# Patient Record
Sex: Male | Born: 1940
Health system: Southern US, Community
[De-identification: ages and names within clinical notes are randomized; demographics above are authoritative.]

## PROBLEM LIST (undated history)

## (undated) DIAGNOSIS — Z8619 Personal history of other infectious and parasitic diseases: Secondary | ICD-10-CM

## (undated) DIAGNOSIS — H409 Unspecified glaucoma: Secondary | ICD-10-CM

## (undated) DIAGNOSIS — I10 Essential (primary) hypertension: Secondary | ICD-10-CM

## (undated) HISTORY — DX: Personal history of other infectious and parasitic diseases: Z86.19

## (undated) HISTORY — DX: Essential (primary) hypertension: I10

## (undated) HISTORY — DX: Unspecified glaucoma: H40.9

## (undated) HISTORY — PX: CATARACT EXTRACTION, BILATERAL: SHX1313

---

## 2007-07-20 ENCOUNTER — Other Ambulatory Visit: Payer: Self-pay

## 2007-07-20 ENCOUNTER — Ambulatory Visit: Payer: Self-pay | Admitting: Ophthalmology

## 2007-08-08 ENCOUNTER — Ambulatory Visit: Payer: Self-pay | Admitting: Ophthalmology

## 2007-09-05 ENCOUNTER — Ambulatory Visit: Payer: Self-pay | Admitting: Ophthalmology

## 2007-09-19 ENCOUNTER — Ambulatory Visit: Payer: Self-pay | Admitting: Ophthalmology

## 2014-07-06 ENCOUNTER — Telehealth: Payer: Self-pay

## 2014-07-06 NOTE — Telephone Encounter (Signed)
Pt walked in requesting refill lisinopril HCTZ 20-12.5 mg to [facility] [address]. Pt has appt to establish with Dr Diona Browner on 08/16/14. Pt does not want to get refill from present doctor; advised pt he needs to take his med; offered to look for sooner appt to establish and pt said no; pt will go to [facility] to gt med until appt time.

## 2014-08-16 ENCOUNTER — Encounter: Payer: Self-pay | Admitting: Family Medicine

## 2014-08-16 ENCOUNTER — Ambulatory Visit (INDEPENDENT_AMBULATORY_CARE_PROVIDER_SITE_OTHER): Payer: Medicare Other | Admitting: Family Medicine

## 2014-08-16 ENCOUNTER — Encounter (INDEPENDENT_AMBULATORY_CARE_PROVIDER_SITE_OTHER): Payer: Self-pay

## 2014-08-16 VITALS — BP 122/90 | HR 64 | Temp 98.7°F | Ht 68.75 in | Wt 197.0 lb

## 2014-08-16 DIAGNOSIS — N529 Male erectile dysfunction, unspecified: Secondary | ICD-10-CM

## 2014-08-16 DIAGNOSIS — H409 Unspecified glaucoma: Secondary | ICD-10-CM | POA: Insufficient documentation

## 2014-08-16 DIAGNOSIS — I10 Essential (primary) hypertension: Secondary | ICD-10-CM

## 2014-08-16 DIAGNOSIS — D400 Neoplasm of uncertain behavior of prostate: Secondary | ICD-10-CM | POA: Insufficient documentation

## 2014-08-16 DIAGNOSIS — Z13220 Encounter for screening for lipoid disorders: Secondary | ICD-10-CM

## 2014-08-16 LAB — LIPID PANEL
CHOLESTEROL: 172 mg/dL (ref 0–200)
HDL: 37.8 mg/dL — ABNORMAL LOW (ref >39.00)
LDL CALC: 118 mg/dL — AB (ref 0–99)
NonHDL: 134.2
Total CHOL/HDL Ratio: 5
Triglycerides: 81 mg/dL (ref 0.0–149.0)
VLDL: 16.2 mg/dL (ref 0.0–40.0)

## 2014-08-16 LAB — COMPREHENSIVE METABOLIC PANEL
ALBUMIN: 4.2 g/dL (ref 3.5–5.2)
ALT: 18 U/L (ref 0–53)
AST: 16 U/L (ref 0–37)
Alkaline Phosphatase: 57 U/L (ref 39–117)
BUN: 20 mg/dL (ref 6–23)
CALCIUM: 9.4 mg/dL (ref 8.4–10.5)
CO2: 31 mEq/L (ref 19–32)
Chloride: 103 mEq/L (ref 96–112)
Creatinine, Ser: 1.08 mg/dL (ref 0.40–1.50)
GFR: 86.4 mL/min (ref >60.00)
GLUCOSE: 92 mg/dL (ref 70–99)
POTASSIUM: 4.4 meq/L (ref 3.5–5.1)
SODIUM: 139 meq/L (ref 135–145)
Total Bilirubin: 0.7 mg/dL (ref 0.2–1.2)
Total Protein: 7.4 g/dL (ref 6.0–8.3)

## 2014-08-16 LAB — PSA, MEDICARE: PSA: 15.2 ng/ml — ABNORMAL HIGH (ref 0.10–4.00)

## 2014-08-16 MED ORDER — LISINOPRIL-HYDROCHLOROTHIAZIDE 20-12.5 MG PO TABS: 1.0000 | ORAL_TABLET | Freq: Every day | ORAL | Status: DC

## 2014-08-16 NOTE — Progress Notes (Signed)
Pre visit review using our clinic review tool, if applicable. No additional management support is needed unless otherwise documented below in the visit note. 

## 2014-08-16 NOTE — Patient Instructions (Addendum)
Stop at lab on way out.  Schedule medicare wellness with NO labs prior in 3-6 months.

## 2014-08-16 NOTE — Progress Notes (Signed)
   Subjective:    Patient ID: Bobby Simpson, male    DOB: 1941/03/17, 74 y.o.   MRN: [id number]  HPI   74 year old male presents to establish.  Previous MD :  Dr. Lovie Macadamia. Last OV over a year ago.   In 2008 saw Dr. Bernardo Heater for rising PSAs.  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 Followed PSAs IN 2010 saw Dr. Yves Dill Biopsy showed one positive site. In 06/2013: Referred to [facility]. Dr. Thomos Lemons  Had MRI. Did targeted biopsy this time.  Recommended repeat biopsy in 1 year. That would be in in 09/2014. He is unsure about procedding with repeat biospy given past issues.     Hypertension:  Well controlled on lisinopril HCTZ, has not yet taken it today. BP Readings from Last 3 Encounters:  08/16/14 122/90  Using medication without problems or lightheadedness: None Chest pain with exertion:None Edema:None Short of breath:None Average home BPs:120/80s Other issues:  Walking daily for exercise: Diet: healthy, Limited fried foods, meat, eats a lot of fish.  He is not interested in any vaccines. Not sure if ever had colonoscopy.  Review of Systems  Constitutional: Negative for fever, fatigue and unexpected weight change.  HENT: Negative for congestion, ear pain, postnasal drip, rhinorrhea, sore throat and trouble swallowing.   Eyes: Negative for pain.  Respiratory: Negative for cough, shortness of breath and wheezing.   Cardiovascular: Negative for chest pain, palpitations and leg swelling.  Gastrointestinal: Negative for nausea, abdominal pain, diarrhea, constipation and blood in stool.  Genitourinary: Negative for dysuria, urgency, hematuria, discharge, penile swelling, scrotal swelling, difficulty urinating, penile pain and testicular pain.  Skin: Negative for rash.  Neurological: Negative for syncope, weakness, light-headedness, numbness and headaches.  Psychiatric/Behavioral: Negative for behavioral problems and dysphoric mood. The patient is not nervous/anxious.          Objective:   Physical Exam  Constitutional: He is oriented to person, place, and time. Vital signs are normal. He appears well-developed and well-nourished.  HENT:  Head: Normocephalic.  Right Ear: Hearing normal.  Left Ear: Hearing normal.  Nose: Nose normal.  Mouth/Throat: Oropharynx is clear and moist and mucous membranes are normal.  Neck: Trachea normal. Carotid bruit is not present. No thyroid mass and no thyromegaly present.  Cardiovascular: Normal rate, regular rhythm and normal pulses.  Exam reveals no gallop, no distant heart sounds and no friction rub.   No murmur heard. No peripheral edema  Pulmonary/Chest: Effort normal and breath sounds normal. No respiratory distress.  Musculoskeletal: Normal range of motion.  Neurological: He is alert and oriented to person, place, and time.  Skin: Skin is warm, dry and intact. No rash noted.  Psychiatric: He has a normal mood and affect. His speech is normal and behavior is normal. Thought content normal.          Assessment & Plan:

## 2014-08-17 ENCOUNTER — Telehealth: Payer: Self-pay | Admitting: Family Medicine

## 2014-08-17 NOTE — Telephone Encounter (Signed)
emmi mailed  °

## 2014-12-20 ENCOUNTER — Encounter: Payer: Self-pay | Admitting: Family Medicine

## 2014-12-20 ENCOUNTER — Ambulatory Visit (INDEPENDENT_AMBULATORY_CARE_PROVIDER_SITE_OTHER): Payer: Medicare Other | Admitting: Family Medicine

## 2014-12-20 VITALS — BP 130/82 | HR 64 | Temp 97.8°F | Ht 69.0 in | Wt 194.5 lb

## 2014-12-20 DIAGNOSIS — Z7189 Other specified counseling: Secondary | ICD-10-CM | POA: Insufficient documentation

## 2014-12-20 DIAGNOSIS — D400 Neoplasm of uncertain behavior of prostate: Secondary | ICD-10-CM

## 2014-12-20 DIAGNOSIS — Z0000 Encounter for general adult medical examination without abnormal findings: Secondary | ICD-10-CM | POA: Diagnosis not present

## 2014-12-20 DIAGNOSIS — I10 Essential (primary) hypertension: Secondary | ICD-10-CM

## 2014-12-20 DIAGNOSIS — Z1211 Encounter for screening for malignant neoplasm of colon: Secondary | ICD-10-CM

## 2014-12-20 NOTE — Progress Notes (Signed)
Pre visit review using our clinic review tool, if applicable. No additional management support is needed unless otherwise documented below in the visit note. 

## 2014-12-20 NOTE — Patient Instructions (Addendum)
Stop at lab to pick up iFOB.  Keep working on regular exercise and healthy eating.

## 2014-12-20 NOTE — Progress Notes (Signed)
   Subjective:    Patient ID: Bobby Simpson, male    DOB: 02/06/1941, 74 y.o.   MRN: [id number]  HPI  I have personally reviewed the Medicare Annual Wellness questionnaire and have noted 1. The patient's medical and social history 2. Their use of alcohol, tobacco or illicit drugs 3. Their current medications and supplements 4. The patient's functional ability including ADL's, fall risks, home safety risks and hearing or visual             impairment. 5. Diet and physical activities 6. Evidence for depression or mood disorders 7.         Updated provider list Cognitive evaluation was performed and recorded on pt medicare questionnaire form. The patients weight, height, BMI and visual acuity have been recorded in the chart  I have made referrals, counseling and provided education to the patient based review of the above and I have provided the pt with a written personalized care plan for preventive services.   Hypertension:  Well controlled on  Lisinopril HCTZ. BP Readings from Last 3 Encounters:  12/20/14 130/82  08/16/14 122/90  Using medication without problems or lightheadedness:  None Chest pain with exertion:NOne Edema:None Short of breath:None Average home BPs: not checking lately. Other issues:  Walking daily for exercise. Diet: healthy, Limited fried foods, meat, eats a lot of fish.  Reviewed labs in 08/2014 CMET and lipids in goal range. LDL 118.     Review of Systems  Constitutional: Negative for fever and fatigue.  HENT: Negative for ear pain.   Eyes: Negative for pain.  Respiratory: Negative for cough and shortness of breath.   Cardiovascular: Negative for chest pain, palpitations and leg swelling.  Gastrointestinal: Negative for abdominal pain.  Genitourinary: Negative for dysuria.  Musculoskeletal: Negative for arthralgias.  Neurological: Negative for syncope, light-headedness and headaches.  Psychiatric/Behavioral: Negative for dysphoric mood.         Objective:   Physical Exam  Constitutional: Vital signs are normal. He appears well-developed and well-nourished.  HENT:  Head: Normocephalic.  Right Ear: Hearing normal.  Left Ear: Hearing normal.  Nose: Nose normal.  Mouth/Throat: Oropharynx is clear and moist and mucous membranes are normal.  Neck: Trachea normal. Carotid bruit is not present. No thyroid mass and no thyromegaly present.  Cardiovascular: Normal rate, regular rhythm and normal pulses.  Exam reveals no gallop, no distant heart sounds and no friction rub.   No murmur heard. No peripheral edema  Pulmonary/Chest: Effort normal and breath sounds normal. No respiratory distress.  Skin: Skin is warm, dry and intact. No rash noted.  SK on right dorsal arm, pt reassured.  Psychiatric: He has a normal mood and affect. His speech is normal and behavior is normal. Thought content normal.     Prostate exam at uro.      Assessment & Plan:  The patient's preventative maintenance and recommended screening tests for an annual wellness exam were reviewed in full today. Brought up to date unless services declined.  Counselled on the importance of diet, exercise, and its role in overall health and mortality. The patient's FH and SH was reviewed, including their home life, tobacco status, and drug and alcohol status.   Vaccines:He is not interested in any vaccines. Not sure if ever had colonoscopy. He refuses colonoscopy.  He will do IFOB. Prostate: per Mallie Mussel,  He refused repeat biopsy.  He has decided ( reviewed uro note) that he does not want any further PSAs.

## 2014-12-20 NOTE — Assessment & Plan Note (Signed)
Well controlled. Continue current medication.  

## 2015-07-23 DIAGNOSIS — H401131 Primary open-angle glaucoma, bilateral, mild stage: Secondary | ICD-10-CM | POA: Diagnosis not present

## 2015-12-17 ENCOUNTER — Other Ambulatory Visit (INDEPENDENT_AMBULATORY_CARE_PROVIDER_SITE_OTHER): Payer: PPO

## 2015-12-17 ENCOUNTER — Ambulatory Visit (INDEPENDENT_AMBULATORY_CARE_PROVIDER_SITE_OTHER): Payer: PPO

## 2015-12-17 ENCOUNTER — Telehealth: Payer: Self-pay | Admitting: Family Medicine

## 2015-12-17 VITALS — BP 120/80 | HR 66 | Temp 98.8°F | Ht 68.5 in | Wt 190.0 lb

## 2015-12-17 DIAGNOSIS — I10 Essential (primary) hypertension: Secondary | ICD-10-CM | POA: Diagnosis not present

## 2015-12-17 DIAGNOSIS — Z125 Encounter for screening for malignant neoplasm of prostate: Secondary | ICD-10-CM | POA: Diagnosis not present

## 2015-12-17 DIAGNOSIS — Z0000 Encounter for general adult medical examination without abnormal findings: Secondary | ICD-10-CM | POA: Diagnosis not present

## 2015-12-17 LAB — COMPREHENSIVE METABOLIC PANEL
ALT: 15 U/L (ref 0–53)
AST: 15 U/L (ref 0–37)
Albumin: 4.1 g/dL (ref 3.5–5.2)
Alkaline Phosphatase: 57 U/L (ref 39–117)
BILIRUBIN TOTAL: 0.7 mg/dL (ref 0.2–1.2)
BUN: 21 mg/dL (ref 6–23)
CALCIUM: 9.6 mg/dL (ref 8.4–10.5)
CO2: 30 mEq/L (ref 19–32)
Chloride: 105 mEq/L (ref 96–112)
Creatinine, Ser: 1.1 mg/dL (ref 0.40–1.50)
GFR: 84.28 mL/min (ref >60.00)
GLUCOSE: 104 mg/dL — AB (ref 70–99)
Potassium: 4 mEq/L (ref 3.5–5.1)
Sodium: 142 mEq/L (ref 135–145)
Total Protein: 7.3 g/dL (ref 6.0–8.3)

## 2015-12-17 LAB — LIPID PANEL
CHOL/HDL RATIO: 5
Cholesterol: 164 mg/dL (ref 0–200)
HDL: 34.8 mg/dL — ABNORMAL LOW (ref >39.00)
LDL Cholesterol: 110 mg/dL — ABNORMAL HIGH (ref 0–99)
NONHDL: 129.57
Triglycerides: 99 mg/dL (ref 0.0–149.0)
VLDL: 19.8 mg/dL (ref 0.0–40.0)

## 2015-12-17 LAB — PSA, MEDICARE: PSA: 22.08 ng/mL — AB (ref 0.10–4.00)

## 2015-12-17 NOTE — Patient Instructions (Addendum)
[name] , Thank you for taking time to come for your Medicare Wellness Visit. I appreciate your ongoing commitment to your health goals. Please review the following plan we discussed and let me know if I can assist you in the future.   These are the goals we discussed: Goals    . Increase physical activity     Starting 12/17/2015, I will continue to walk at least 30 min 6 days per week.        This is a list of the screening recommended for you and due dates:  Health Maintenance  Topic Date Due  . Cologuard (Stool DNA test)  03/08/2016*  . Shingles Vaccine  12/16/2025*  . Tetanus Vaccine  12/16/2025*  . Pneumonia vaccines (1 of 2 - PCV13) 12/16/2025*  . Flu Shot  01/07/2016  *Topic was postponed. The date shown is not the original due date.      Preventive Care for Adults  A healthy lifestyle and preventive care can promote health and wellness. Preventive health guidelines for adults include the following key practices.  . A routine yearly physical is a good way to check with your health care provider about your health and preventive screening. It is a chance to share any concerns and updates on your health and to receive a thorough exam.  . Visit your dentist for a routine exam and preventive care every 6 months. Brush your teeth twice a day and floss once a day. Good oral hygiene prevents tooth decay and gum disease.  . The frequency of eye exams is based on your age, health, family medical history, use  of contact lenses, and other factors. Follow your health care provider's ecommendations for frequency of eye exams.  . Eat a healthy diet. Foods like vegetables, fruits, whole grains, low-fat dairy products, and lean protein foods contain the nutrients you need without too many calories. Decrease your intake of foods high in solid fats, added sugars, and salt. Eat the right amount of calories for you. Get information about a proper diet from your health care provider, if  necessary.  . Regular physical exercise is one of the most important things you can do for your health. Most adults should get at least 150 minutes of moderate-intensity exercise (any activity that increases your heart rate and causes you to sweat) each week. In addition, most adults need muscle-strengthening exercises on 2 or more days a week.  Silver Sneakers may be a benefit available to you. To determine eligibility, you may visit the website: www.silversneakers.com or contact program at [phone]) [phone] Mon-Fri between 8AM-8PM.   . Maintain a healthy weight. The body mass index (BMI) is a screening tool to identify possible weight problems. It provides an estimate of body fat based on height and weight. Your health care provider can find your BMI and can help you achieve or maintain a healthy weight.   For adults 20 years and older: ? A BMI below 18.5 is considered underweight. ? A BMI of 18.5 to 24.9 is normal. ? A BMI of 25 to 29.9 is considered overweight. ? A BMI of 30 and above is considered obese.   . Maintain normal blood lipids and cholesterol levels by exercising and minimizing your intake of saturated fat. Eat a balanced diet with plenty of fruit and vegetables. Blood tests for lipids and cholesterol should begin at age 54 and be repeated every 5 years. If your lipid or cholesterol levels are high, you are over 50, or you  are at high risk for heart disease, you may need your cholesterol levels checked more frequently. Ongoing high lipid and cholesterol levels should be treated with medicines if diet and exercise are not working.  . If you smoke, find out from your health care provider how to quit. If you do not use tobacco, please do not start.  . If you choose to drink alcohol, please do not consume more than 2 drinks per day. One drink is considered to be 12 ounces (355 mL) of beer, 5 ounces (148 mL) of wine, or 1.5 ounces (44 mL) of liquor.  . If you are 66-51 years old, ask your  health care provider if you should take aspirin to prevent strokes.  . Use sunscreen. Apply sunscreen liberally and repeatedly throughout the day. You should seek shade when your shadow is shorter than you. Protect yourself by wearing long sleeves, pants, a wide-brimmed hat, and sunglasses year round, whenever you are outdoors.  . Once a month, do a whole body skin exam, using a mirror to look at the skin on your back. Tell your health care provider of new moles, moles that have irregular borders, moles that are larger than a pencil eraser, or moles that have changed in shape or color.

## 2015-12-17 NOTE — Progress Notes (Signed)
PCP notes:  Health maintenance:   Colon cancer screening - pt agreed to do Cologuard PNA - declined Shingles - declined Tetanus - declined  Abnormal screenings:   Hearing - failed  Patient concerns: None  Nurse concerns: None  Next PCP appt: 01/02/2016 @ 0900

## 2015-12-17 NOTE — Telephone Encounter (Signed)
-----   Message from Ellamae Sia sent at 12/11/2015 11:15 AM EDT ----- Regarding: Lab orders for Tuesday, 7.11.17  AWV lab orders, please.

## 2015-12-17 NOTE — Progress Notes (Signed)
Pre visit review using our clinic review tool, if applicable. No additional management support is needed unless otherwise documented below in the visit note. 

## 2015-12-17 NOTE — Progress Notes (Signed)
Subjective:   Bobby Simpson is a 75 y.o. male who presents for Medicare Annual/Subsequent preventive examination.  Review of Systems:  N/A Cardiac Risk Factors include: advanced age (>84men, >25 women);male gender;hypertension     Objective:    Vitals: BP 120/80 mmHg  Pulse 66  Temp(Src) 98.8 F (37.1 C) (Oral)  Ht 5' 8.5" (1.74 m)  Wt 190 lb (86.183 kg)  BMI 28.47 kg/m2  SpO2 95%  Body mass index is 28.47 kg/(m^2).  Tobacco History  Smoking status  . Former Smoker -- 0.25 packs/day for 5 years  . Types: Cigarettes  Smokeless tobacco  . Former Systems developer  . Quit date: 06/17/1960     Counseling given: No   Past Medical History  Diagnosis Date  . History of chicken pox   . Glaucoma   . Hypertension    Past Surgical History  Procedure Laterality Date  . Cataract extraction, bilateral     Family History  Problem Relation Age of Onset  . Cancer Father     unsure type  . Asthma Brother    History  Sexual Activity  . Sexual Activity: Yes    Outpatient Encounter Prescriptions as of 12/17/2015  Medication Sig  . latanoprost (XALATAN) 0.005 % ophthalmic solution Place 1 drop into both eyes at bedtime.   Marland Kitchen lisinopril-hydrochlorothiazide (PRINZIDE,ZESTORETIC) 20-12.5 MG per tablet Take 1 tablet by mouth daily.  . Multiple Vitamins-Minerals (ONE-A-DAY 50 PLUS PO) Take 1 tablet by mouth daily.  . timolol (TIMOPTIC) 0.5 % ophthalmic solution Place 1 drop into both eyes daily.    No facility-administered encounter medications on file as of 12/17/2015.    Activities of Daily Living In your present state of health, do you have any difficulty performing the following activities: 12/17/2015  Hearing? Y  Vision? N  Difficulty concentrating or making decisions? N  Walking or climbing stairs? N  Dressing or bathing? N  Doing errands, shopping? N  Preparing Food and eating ? N  Using the Toilet? N  In the past six months, have you accidently leaked urine? N  Do you have  problems with loss of bowel control? N  Managing your Medications? N  Managing your Finances? N  Housekeeping or managing your Housekeeping? N    Patient Care Team: Jinny Sanders, MD as PCP - General (Family Medicine) Ronnell Freshwater, MD as Referring Physician (Ophthalmology)   Assessment:     Hearing Screening   125Hz  250Hz  500Hz  1000Hz  2000Hz  4000Hz  8000Hz   Right ear:   40 40 40 0   Left ear:   40 0 40 0   Vision Screening Comments: Last visit with Dr. Cephus Shelling on 07/23/2015   Exercise Activities and Dietary recommendations Current Exercise Habits: Home exercise routine, Type of exercise: walking, Time (Minutes): 30, Frequency (Times/Week): 6, Weekly Exercise (Minutes/Week): 180, Intensity: Mild, Exercise limited by: None identified  Goals    . Increase physical activity     Starting 12/17/2015, I will continue to walk at least 30 min 6 days per week.       Fall Risk Fall Risk  12/17/2015 12/20/2014  Falls in the past year? No No   Depression Screen PHQ 2/9 Scores 12/17/2015 12/20/2014  PHQ - 2 Score 0 0    Cognitive Testing MMSE - Mini Mental State Exam 12/17/2015  Orientation to time 5  Orientation to Place 5  Registration 3  Attention/ Calculation 0  Recall 3  Language- name 2 objects 0  Language- repeat 1  Language- follow 3 step command 3  Language- read & follow direction 0  Write a sentence 0  Copy design 0  Total score 20   PLEASE NOTE: A Mini-Cog screen was completed. Maximum score is 20. A value of 0 denotes this part of Folstein MMSE was not completed or the patient failed this part of the Mini-Cog screening.   Mini-Cog Screening Orientation to Time - Max 5 pts Orientation to Place - Max 5 pts Registration - Max 3 pts Recall - Max 3 pts Language Repeat - Max 1 pts Language Follow 3 Step Command - Max 3 pts   There is no immunization history on file for this patient. Screening Tests Health Maintenance  Topic Date Due  . Fecal DNA (Cologuard)   03/08/2016 (Originally 06/26/1992)  . ZOSTAVAX  12/16/2025 (Originally 06/26/2002)  . TETANUS/TDAP  12/16/2025 (Originally 06/26/1961)  . PNA vac Low Risk Adult (1 of 2 - PCV13) 12/16/2025 (Originally 06/27/2007)  . INFLUENZA VACCINE  01/07/2016      Plan:     I have personally reviewed and addressed the Medicare Annual Wellness questionnaire and have noted the following in the patient's chart:  A. Medical and social history B. Use of alcohol, tobacco or illicit drugs  C. Current medications and supplements D. Functional ability and status E.  Nutritional status F.  Physical activity G. Advance directives H. List of other physicians I.  Hospitalizations, surgeries, and ER visits in previous 12 months J.  Paul to include hearing, vision, cognitive, depression L. Referrals and appointments - none  In addition, I have reviewed and discussed with patient certain preventive protocols, quality metrics, and best practice recommendations. A written personalized care plan for preventive services as well as general preventive health recommendations were provided to patient.  See attached scanned questionnaire for additional information.   Signed,   Lindell Noe, MHA, BS, LPN Health Advisor

## 2015-12-17 NOTE — Progress Notes (Signed)
I reviewed health advisor's note, was available for consultation, and agree with documentation and plan.  Amy Bedsole, MD [facility] [place]  

## 2015-12-22 DIAGNOSIS — Z1212 Encounter for screening for malignant neoplasm of rectum: Secondary | ICD-10-CM | POA: Diagnosis not present

## 2015-12-22 DIAGNOSIS — Z1211 Encounter for screening for malignant neoplasm of colon: Secondary | ICD-10-CM | POA: Diagnosis not present

## 2015-12-22 LAB — COLOGUARD: Cologuard: NEGATIVE

## 2015-12-24 ENCOUNTER — Encounter: Payer: Medicare Other | Admitting: Family Medicine

## 2016-01-02 ENCOUNTER — Encounter: Payer: Medicare Other | Admitting: Family Medicine

## 2016-01-03 ENCOUNTER — Encounter: Payer: Self-pay | Admitting: Family Medicine

## 2016-01-21 DIAGNOSIS — H401131 Primary open-angle glaucoma, bilateral, mild stage: Secondary | ICD-10-CM | POA: Diagnosis not present

## 2016-01-28 DIAGNOSIS — H401131 Primary open-angle glaucoma, bilateral, mild stage: Secondary | ICD-10-CM | POA: Diagnosis not present

## 2016-03-06 ENCOUNTER — Other Ambulatory Visit: Payer: Self-pay

## 2016-03-06 MED ORDER — LISINOPRIL-HYDROCHLOROTHIAZIDE 20-12.5 MG PO TABS: 1.0000 | ORAL_TABLET | Freq: Every day | ORAL | 0 refills | Status: DC

## 2016-03-06 NOTE — Telephone Encounter (Signed)
Note was left requesting 90 day refill lisinopril-HCTZ to [facility] [address].last seen 12/20/14 but has CPX scheduled on 04/10/16. Refilled per protocol. Pt voiced understanding.

## 2016-04-10 ENCOUNTER — Ambulatory Visit (INDEPENDENT_AMBULATORY_CARE_PROVIDER_SITE_OTHER): Payer: PPO | Admitting: Family Medicine

## 2016-04-10 ENCOUNTER — Encounter: Payer: Self-pay | Admitting: Family Medicine

## 2016-04-10 VITALS — BP 132/80 | HR 63 | Temp 98.8°F | Ht 68.5 in | Wt 198.0 lb

## 2016-04-10 DIAGNOSIS — D400 Neoplasm of uncertain behavior of prostate: Secondary | ICD-10-CM

## 2016-04-10 DIAGNOSIS — R7303 Prediabetes: Secondary | ICD-10-CM | POA: Insufficient documentation

## 2016-04-10 DIAGNOSIS — I10 Essential (primary) hypertension: Secondary | ICD-10-CM

## 2016-04-10 DIAGNOSIS — R972 Elevated prostate specific antigen [PSA]: Secondary | ICD-10-CM | POA: Diagnosis not present

## 2016-04-10 NOTE — Assessment & Plan Note (Signed)
Work on low [employer].

## 2016-04-10 NOTE — Assessment & Plan Note (Addendum)
Discussed growing concern for and high likelyhood of prostate cancer given rapidly increasing PSA. I spent considerable time discussing my recommendaiton for further eval with urology at [facility] for if not biopsy, at least further recommendations for eval and possible treatment of likely prostate cancer. Discused how the goal would be to treat before he had metastatic disease for increased success in remission. He vpoiced understanding. Pt  Had nml mental status exam at [facility] exam and is capable of making informed decisions. He has decided to  " take my chances", he refused further eval of prostate.

## 2016-04-10 NOTE — Assessment & Plan Note (Signed)
Well controlled. Continue current medication.  

## 2016-04-10 NOTE — Progress Notes (Signed)
HPI  Earlier  On 12/17/2015 he saw Candis Musa, LPN for medicare wellness. Note reviewed in detail when completed. Failed hearing.  Feeling well  Overall.   No new urinary issues, no unexpected weight los, no fatigue. Nml mood. See screening at [facility] visit.  No falls.  He has noted slight decrease in hearing.. Not bothersome.  Hypertension:  Well controlled on  Lisinopril HCTZ. BP Readings from Last 3 Encounters:  04/10/16 132/80  12/17/15 120/80  12/20/14 130/82  Using medication without problems or lightheadedness:  None Chest pain with exertion:NOne Edema:None Short of breath:None Average home BPs:  120/70 Other issues:  Walking daily for exercise. Wlaks the dopg. Diet: healthy, Limited fried foods, meat, eats a lot of fish.  Elevated PSA:  Per Dr. 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  Per URO note: Patients with ASAP may be found to have a higher risk of prostate cancer found on a subsequent prostate biopsy. ASAP often is associated with cancer detected adjacent to it. Pt refused more biopsies.  Lab Results  Component Value Date   CHOL 164 12/17/2015   HDL 34.80 (L) 12/17/2015   LDLCALC 110 (H) 12/17/2015   TRIG 99.0 12/17/2015   CHOLHDL 5 12/17/2015     Social History /Family History/Past Medical History reviewed and updated if needed.    Review of Systems  Constitutional: Negative for fever and fatigue.  HENT: Negative for ear pain.   Eyes: Negative for pain.  Respiratory: Negative for cough and shortness of breath.   Cardiovascular: Negative for chest pain, palpitations and leg swelling.  Gastrointestinal: Negative for abdominal pain.  Genitourinary: Negative for dysuria.  Musculoskeletal: Negative for arthralgias.  Neurological: Negative for syncope, light-headedness and headaches.  Psychiatric/Behavioral: Negative for dysphoric mood.        Objective:   Physical Exam  Constitutional: Vital signs are normal. He appears well-developed and well-nourished.  HENT:  Head: Normocephalic.  Right Ear: Hearing normal.  Left Ear: Hearing normal.  Nose: Nose normal.  Mouth/Throat: Oropharynx is clear and moist and mucous membranes are normal.  Neck: Trachea normal. Carotid bruit is not present. No thyroid mass and no thyromegaly present.  Cardiovascular: Normal rate, regular rhythm and normal pulses.  Exam reveals no gallop, no distant heart sounds and no friction rub.   No murmur heard. No peripheral edema  Pulmonary/Chest: Effort normal and breath sounds normal. No respiratory distress.  Skin: Skin is warm, dry and intact. No rash noted.  SK on right dorsal arm, pt reassured.  Psychiatric: He has a normal mood and affect. His speech is normal and behavior is normal. Thought content normal.     Prostate exam : refused.      Assessment & Plan:  The patient's preventative maintenance and recommended screening tests for an annual wellness exam were reviewed in full today. Brought up to date unless services declined.  Counselled on the importance of diet, exercise, and its role in overall health and mortality. The patient's FH and SH was reviewed, including their home life, tobacco status, and drug and alcohol status.   Vaccines: He is not interested in any vaccines. Colon: Cologuard  Negative 2017 Prostate: per Mallie Mussel,  He refused repeat biopsy.  He has decided (Reviewed uro note) that he does not want any further PSAs.   Lab Results  Component Value Date   PSA 22.08 (H) 12/17/2015   PSA 15.20 (H)  08/16/2014   Former smoker, small amount, Quit remotely in 1962.

## 2016-04-10 NOTE — Patient Instructions (Signed)
Work on low [employer]. Continue regular exercise daily.

## 2016-04-10 NOTE — Progress Notes (Signed)
Pre visit review using our clinic review tool, if applicable. No additional management support is needed unless otherwise documented below in the visit note. 

## 2016-07-28 DIAGNOSIS — H401131 Primary open-angle glaucoma, bilateral, mild stage: Secondary | ICD-10-CM | POA: Diagnosis not present

## 2016-08-27 ENCOUNTER — Telehealth: Payer: Self-pay | Admitting: Family Medicine

## 2016-08-27 NOTE — Telephone Encounter (Signed)
Pt said he was returning a call to [facility]; was not sure who called him; advised refill done for lisinopril-HCTZ. Pt voiced understanding and said if anyone else needs him to cb.

## 2017-01-25 DIAGNOSIS — H401131 Primary open-angle glaucoma, bilateral, mild stage: Secondary | ICD-10-CM | POA: Diagnosis not present

## 2017-02-01 DIAGNOSIS — H401131 Primary open-angle glaucoma, bilateral, mild stage: Secondary | ICD-10-CM | POA: Diagnosis not present

## 2017-04-12 ENCOUNTER — Telehealth: Payer: Self-pay | Admitting: Family Medicine

## 2017-04-12 ENCOUNTER — Ambulatory Visit (INDEPENDENT_AMBULATORY_CARE_PROVIDER_SITE_OTHER): Payer: PPO

## 2017-04-12 VITALS — BP 120/70 | HR 62 | Temp 97.9°F | Ht 68.25 in | Wt 193.0 lb

## 2017-04-12 DIAGNOSIS — I10 Essential (primary) hypertension: Secondary | ICD-10-CM

## 2017-04-12 DIAGNOSIS — R7303 Prediabetes: Secondary | ICD-10-CM | POA: Diagnosis not present

## 2017-04-12 DIAGNOSIS — Z0000 Encounter for general adult medical examination without abnormal findings: Secondary | ICD-10-CM

## 2017-04-12 DIAGNOSIS — R972 Elevated prostate specific antigen [PSA]: Secondary | ICD-10-CM

## 2017-04-12 LAB — LIPID PANEL
CHOL/HDL RATIO: 4
CHOLESTEROL: 160 mg/dL (ref 0–200)
HDL: 38.2 mg/dL — ABNORMAL LOW (ref >39.00)
LDL CALC: 111 mg/dL — AB (ref 0–99)
NonHDL: 122.2
Triglycerides: 56 mg/dL (ref 0.0–149.0)
VLDL: 11.2 mg/dL (ref 0.0–40.0)

## 2017-04-12 LAB — HEMOGLOBIN A1C: HEMOGLOBIN A1C: 6.1 % (ref 4.6–6.5)

## 2017-04-12 LAB — COMPREHENSIVE METABOLIC PANEL
ALK PHOS: 61 U/L (ref 39–117)
ALT: 16 U/L (ref 0–53)
AST: 15 U/L (ref 0–37)
Albumin: 4 g/dL (ref 3.5–5.2)
BILIRUBIN TOTAL: 0.5 mg/dL (ref 0.2–1.2)
BUN: 19 mg/dL (ref 6–23)
CALCIUM: 9.5 mg/dL (ref 8.4–10.5)
CO2: 31 meq/L (ref 19–32)
Chloride: 106 mEq/L (ref 96–112)
Creatinine, Ser: 1.11 mg/dL (ref 0.40–1.50)
GFR: 83.1 mL/min (ref >60.00)
Glucose, Bld: 104 mg/dL — ABNORMAL HIGH (ref 70–99)
POTASSIUM: 4.2 meq/L (ref 3.5–5.1)
Sodium: 142 mEq/L (ref 135–145)
Total Protein: 7.2 g/dL (ref 6.0–8.3)

## 2017-04-12 NOTE — Progress Notes (Signed)
I reviewed health advisor's note, was available for consultation, and agree with documentation and plan.   Signed,  Eliz Nigg T. Rajvi Armentor, MD  

## 2017-04-12 NOTE — Progress Notes (Signed)
PCP notes:   Health maintenance:  Flu vaccine - pt declined  Abnormal screenings:   Hearing -failed  Hearing Screening   125Hz  250Hz  500Hz  1000Hz  2000Hz  3000Hz  4000Hz  6000Hz  8000Hz   Right ear:   40 0 40  0    Left ear:   40 40 0  0     Patient concerns:   None  Nurse concerns:  None  Next PCP appt:   04/16/17 @ 1115

## 2017-04-12 NOTE — Telephone Encounter (Signed)
-----   Message from Eustace Pen, LPN sent at 41/02/3789  4:09 PM EDT ----- Regarding: Labs 11/5 Lab orders needed. Thank you.  Insurance:  [employer]

## 2017-04-12 NOTE — Patient Instructions (Addendum)
[name] , Thank you for taking time to come for your Medicare Wellness Visit. I appreciate your ongoing commitment to your health goals. Please review the following plan we discussed and let me know if I can assist you in the future.   These are the goals we discussed: Goals    When weather permits, I will continue to walk at least 2 miles daily.       This is a list of the screening recommended for you and due dates:  Health Maintenance  Topic Date Due  . Flu Shot  09/05/2017*  . Tetanus Vaccine  12/16/2025*  . Pneumonia vaccines (1 of 2 - PCV13) 12/16/2025*  . Cologuard (Stool DNA test)  12/22/2018  *Topic was postponed. The date shown is not the original due date.   Preventive Care for Adults  A healthy lifestyle and preventive care can promote health and wellness. Preventive health guidelines for adults include the following key practices.  . A routine yearly physical is a good way to check with your health care provider about your health and preventive screening. It is a chance to share any concerns and updates on your health and to receive a thorough exam.  . Visit your dentist for a routine exam and preventive care every 6 months. Brush your teeth twice a day and floss once a day. Good oral hygiene prevents tooth decay and gum disease.  . The frequency of eye exams is based on your age, health, family medical history, use  of contact lenses, and other factors. Follow your health care provider's recommendations for frequency of eye exams.  . Eat a healthy diet. Foods like vegetables, fruits, whole grains, low-fat dairy products, and lean protein foods contain the nutrients you need without too many calories. Decrease your intake of foods high in solid fats, added sugars, and salt. Eat the right amount of calories for you. Get information about a proper diet from your health care provider, if necessary.  . Regular physical exercise is one of the most important things you can  do for your health. Most adults should get at least 150 minutes of moderate-intensity exercise (any activity that increases your heart rate and causes you to sweat) each week. In addition, most adults need muscle-strengthening exercises on 2 or more days a week.  Silver Sneakers may be a benefit available to you. To determine eligibility, you may visit the website: www.silversneakers.com or contact program at (540)589-3984 Mon-Fri between 8AM-8PM.   . Maintain a healthy weight. The body mass index (BMI) is a screening tool to identify possible weight problems. It provides an estimate of body fat based on height and weight. Your health care provider can find your BMI and can help you achieve or maintain a healthy weight.   For adults 20 years and older: ? A BMI below 18.5 is considered underweight. ? A BMI of 18.5 to 24.9 is normal. ? A BMI of 25 to 29.9 is considered overweight. ? A BMI of 30 and above is considered obese.   . Maintain normal blood lipids and cholesterol levels by exercising and minimizing your intake of saturated fat. Eat a balanced diet with plenty of fruit and vegetables. Blood tests for lipids and cholesterol should begin at age 28 and be repeated every 5 years. If your lipid or cholesterol levels are high, you are over 50, or you are at high risk for heart disease, you may need your cholesterol levels checked more frequently. Ongoing high lipid and  cholesterol levels should be treated with medicines if diet and exercise are not working.  . If you smoke, find out from your health care provider how to quit. If you do not use tobacco, please do not start.  . If you choose to drink alcohol, please do not consume more than 2 drinks per day. One drink is considered to be 12 ounces (355 mL) of beer, 5 ounces (148 mL) of wine, or 1.5 ounces (44 mL) of liquor.  . If you are 19-53 years old, ask your health care provider if you should take aspirin to prevent strokes.  . Use  sunscreen. Apply sunscreen liberally and repeatedly throughout the day. You should seek shade when your shadow is shorter than you. Protect yourself by wearing long sleeves, pants, a wide-brimmed hat, and sunglasses year round, whenever you are outdoors.  . Once a month, do a whole body skin exam, using a mirror to look at the skin on your back. Tell your health care provider of new moles, moles that have irregular borders, moles that are larger than a pencil eraser, or moles that have changed in shape or color.

## 2017-04-12 NOTE — Progress Notes (Signed)
Pre visit review using our clinic review tool, if applicable. No additional management support is needed unless otherwise documented below in the visit note. 

## 2017-04-12 NOTE — Progress Notes (Signed)
Subjective:   Bobby Simpson is a 76 y.o. male who presents for Medicare Annual/Subsequent preventive examination.  Review of Systems:  N/A Cardiac Risk Factors include: advanced age (>23men, >94 women);male gender;hypertension     Objective:    Vitals: BP 120/70 (BP Location: Right Arm, Patient Position: Sitting, Cuff Size: Normal)   Pulse 62   Temp 97.9 F (36.6 C) (Oral)   Ht 5' 8.25" (1.734 m) Comment: no shoes  Wt 193 lb (87.5 kg)   SpO2 99%   BMI 29.13 kg/m   Body mass index is 29.13 kg/m.  Tobacco Social History   Tobacco Use  Smoking Status Former Smoker  . Packs/day: 0.25  . Years: 5.00  . Pack years: 1.25  . Types: Cigarettes  Smokeless Tobacco Former Systems developer  . Quit date: 06/17/1960     Counseling given: No   Past Medical History:  Diagnosis Date  . Glaucoma   . History of chicken pox   . Hypertension    Past Surgical History:  Procedure Laterality Date  . CATARACT EXTRACTION, BILATERAL     Family History  Problem Relation Age of Onset  . Cancer Father        unsure type  . Asthma Brother    Social History   Substance and Sexual Activity  Sexual Activity Yes    Outpatient Encounter Medications as of 04/12/2017  Medication Sig  . latanoprost (XALATAN) 0.005 % ophthalmic solution Place 1 drop into both eyes at bedtime.   Marland Kitchen lisinopril-hydrochlorothiazide (PRINZIDE,ZESTORETIC) 20-12.5 MG tablet TAKE ONE TABLET BY MOUTH ONCE DAILY  . Multiple Vitamins-Minerals (ONE-A-DAY 50 PLUS PO) Take 1 tablet by mouth daily.  . timolol (TIMOPTIC) 0.5 % ophthalmic solution Place 1 drop into both eyes daily.    No facility-administered encounter medications on file as of 04/12/2017.     Activities of Daily Living In your present state of health, do you have any difficulty performing the following activities: 04/12/2017  Hearing? N  Vision? N  Difficulty concentrating or making decisions? N  Walking or climbing stairs? N  Dressing or bathing? N  Doing  errands, shopping? N  Preparing Food and eating ? N  Using the Toilet? N  In the past six months, have you accidently leaked urine? N  Do you have problems with loss of bowel control? N  Managing your Medications? N  Managing your Finances? N  Housekeeping or managing your Housekeeping? N  Some recent data might be hidden    Patient Care Team: Jinny Sanders, MD as PCP - General (Family Medicine) Karren Burly Deirdre Peer, MD as Referring Physician (Ophthalmology)   Assessment:     Hearing Screening   125Hz  250Hz  500Hz  1000Hz  2000Hz  3000Hz  4000Hz  6000Hz  8000Hz   Right ear:   40 0 40  0    Left ear:   40 40 0  0    Vision Screening Comments: Last vision exam in 2018 (Jan or Feb)   Exercise Activities and Dietary recommendations Current Exercise Habits: Home exercise routine, Type of exercise: walking, Time (Minutes): 45, Frequency (Times/Week): 7, Weekly Exercise (Minutes/Week): 315, Intensity: Mild, Exercise limited by: None identified  Goals    None     Fall Risk Fall Risk  04/12/2017 12/17/2015 12/20/2014  Falls in the past year? No No No   Depression Screen PHQ 2/9 Scores 04/12/2017 12/17/2015 12/20/2014  PHQ - 2 Score 0 0 0  PHQ- 9 Score 0 - -    Cognitive Function MMSE - Mini Mental  State Exam 04/12/2017 12/17/2015  Orientation to time 5 5  Orientation to Place 5 5  Registration 3 3  Attention/ Calculation 0 0  Recall 3 3  Language- name 2 objects 0 0  Language- repeat 1 1  Language- follow 3 step command 3 3  Language- read & follow direction 0 0  Write a sentence 0 0  Copy design 0 0  Total score 20 20       PLEASE NOTE: A Mini-Cog screen was completed. Maximum score is 20. A value of 0 denotes this part of Folstein MMSE was not completed or the patient failed this part of the Mini-Cog screening.   Mini-Cog Screening Orientation to Time - Max 5 pts Orientation to Place - Max 5 pts Registration - Max 3 pts Recall - Max 3 pts Language Repeat - Max 1  pts Language Follow 3 Step Command - Max 3 pts    There is no immunization history on file for this patient. Screening Tests Health Maintenance  Topic Date Due  . INFLUENZA VACCINE  09/05/2017 (Originally 01/06/2017)  . TETANUS/TDAP  12/16/2025 (Originally 06/26/1961)  . PNA vac Low Risk Adult (1 of 2 - PCV13) 12/16/2025 (Originally 06/27/2007)  . Fecal DNA (Cologuard)  12/22/2018      Plan:   I have personally reviewed, addressed, and noted the following in the patient's chart:  A. Medical and social history B. Use of alcohol, tobacco or illicit drugs  C. Current medications and supplements D. Functional ability and status E.  Nutritional status F.  Physical activity G. Advance directives H. List of other physicians I.  Hospitalizations, surgeries, and ER visits in previous 12 months J.  [place] to include hearing, vision, cognitive, depression L. Referrals and appointments - none  In addition, I have reviewed and discussed with patient certain preventive protocols, quality metrics, and best practice recommendations. A written personalized care plan for preventive services as well as general preventive health recommendations were provided to patient.  See attached scanned questionnaire for additional information.   Signed,   Lindell Noe, MHA, BS, LPN Health Coach

## 2017-04-16 ENCOUNTER — Encounter: Payer: Self-pay | Admitting: *Deleted

## 2017-04-16 ENCOUNTER — Other Ambulatory Visit: Payer: Self-pay

## 2017-04-16 ENCOUNTER — Ambulatory Visit (INDEPENDENT_AMBULATORY_CARE_PROVIDER_SITE_OTHER): Payer: PPO | Admitting: Family Medicine

## 2017-04-16 ENCOUNTER — Encounter: Payer: Self-pay | Admitting: Family Medicine

## 2017-04-16 VITALS — BP 110/60 | HR 66 | Temp 98.5°F | Ht 68.25 in | Wt 193.8 lb

## 2017-04-16 DIAGNOSIS — R7303 Prediabetes: Secondary | ICD-10-CM | POA: Diagnosis not present

## 2017-04-16 DIAGNOSIS — D400 Neoplasm of uncertain behavior of prostate: Secondary | ICD-10-CM

## 2017-04-16 DIAGNOSIS — I10 Essential (primary) hypertension: Secondary | ICD-10-CM

## 2017-04-16 DIAGNOSIS — R972 Elevated prostate specific antigen [PSA]: Secondary | ICD-10-CM | POA: Diagnosis not present

## 2017-04-16 DIAGNOSIS — Z0000 Encounter for general adult medical examination without abnormal findings: Secondary | ICD-10-CM

## 2017-04-16 NOTE — Progress Notes (Signed)
Subjective:    Patient ID: Bobby Simpson, male    DOB: 06/11/1940, 76 y.o.   MRN: [id number]  HPI   The patient presents forcomplete physical and review of chronic health problems. He/She also has the following acute concerns today:  The patient saw Candis Musa, LPN for medicare wellness. Note reviewed in detail and important notes copied below.  Health maintenance: Flu vaccine - pt declined Abnormal screenings:  Hearing -failed             Hearing Screening   125Hz  250Hz  500Hz  1000Hz  2000Hz  3000Hz  4000Hz  6000Hz  8000Hz   Right ear:   40 0 40  0    Left ear:   40 40 0  0      04/16/17 today Hypertension:    Stable control on lisinopril HCTZ BP Readings from Last 3 Encounters:  04/16/17 110/60  04/12/17 120/70  04/10/16 132/80  Using medication without problems or lightheadedness:  none Chest pain with exertion:none Edema: none  Short of breath: none Average home BPs: Other issues: Body mass index is 29.24 kg/m.    exercise: walking daily 2 miles, plans to do silver sneakers.  Diet: healthy   Lab Results  Component Value Date   CHOL 160 04/12/2017   HDL 38.20 (L) 04/12/2017   LDLCALC 111 (H) 04/12/2017   TRIG 56.0 04/12/2017   CHOLHDL 4 04/12/2017    Prediabetes:  Lab Results  Component Value Date   HGBA1C 6.1 04/12/2017    Elevated PSA : Per Dr. 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  Per URO note: Patients with ASAP may be found to have a higher risk of prostate cancer found on a subsequent prostate biopsy. ASAP often is associated with cancer detected adjacent to it. Pt refused more biopsies. Refuses further testing and treat,ment. Acknowledges risks as reviewed in detail with pt. Lab Results  Component Value Date   PSA 22.08 (H) 12/17/2015   PSA 15.20 (H) 08/16/2014     Social History /Family History/Past Medical History  reviewed in detail and updated in EMR if needed. Blood pressure 110/60, pulse 66, temperature 98.5 F (36.9 C), temperature source Oral, height 5' 8.25" (1.734 m), weight 193 lb 12 oz (87.9 kg), SpO2 99 %.   Review of Systems  Constitutional: Negative for fatigue and fever.  HENT: Negative for ear pain.   Eyes: Negative for pain.  Respiratory: Negative for cough and shortness of breath.   Cardiovascular: Negative for chest pain, palpitations and leg swelling.  Gastrointestinal: Negative for abdominal pain.  Genitourinary: Negative for dysuria.  Musculoskeletal: Negative for arthralgias.  Neurological: Negative for syncope, light-headedness and headaches.  Psychiatric/Behavioral: Negative for dysphoric mood.       Objective:   Physical Exam  Constitutional: Vital signs are normal. He appears well-developed and well-nourished.  HENT:  Head: Normocephalic.  Right Ear: Hearing normal.  Left Ear: Hearing normal.  Nose: Nose normal.  Mouth/Throat: Oropharynx is clear and moist and mucous membranes are normal.  Neck: Trachea normal. Carotid bruit is not present. No thyroid mass and no thyromegaly present.  Cardiovascular: Normal rate, regular rhythm and normal pulses. Exam reveals no gallop, no distant heart sounds and no friction rub.  No murmur heard. No peripheral edema  Pulmonary/Chest: Effort normal and breath sounds normal. No respiratory distress.  Skin: Skin is warm, dry and intact. No rash noted.  Psychiatric: He has a normal mood and  affect. His speech is normal and behavior is normal. Thought content normal.          Assessment & Plan:  The patient's preventative maintenance and recommended screening tests for an annual wellness exam were reviewed in full today. Brought up to date unless services declined.  Counselled on the importance of diet, exercise, and its role in overall health and mortality. The patient's FH and SH was reviewed, including their home life,  tobacco status, and drug and alcohol status.   Vaccines: He is not interested in any vaccines. Colon: Cologuard  Negative 2017 Prostate: per Mallie Mussel, He refused repeat biopsy. He has decided (Reviewed uro note) that he does not want any further PSAs.  nonsmoker

## 2017-04-16 NOTE — Assessment & Plan Note (Signed)
Continue work on decreasing carbs in diet.  Info given.

## 2017-04-16 NOTE — Patient Instructions (Signed)
Work on low carbohydrate, low cholesterol diet.. Decrease cheese.  Keep up with exercise, try to increase if you can.

## 2017-04-16 NOTE — Assessment & Plan Note (Signed)
Well controlled. Continue current medication. Encouraged exercise, weight loss, healthy eating habits.  

## 2017-08-03 DIAGNOSIS — H401131 Primary open-angle glaucoma, bilateral, mild stage: Secondary | ICD-10-CM | POA: Diagnosis not present

## 2017-09-09 ENCOUNTER — Other Ambulatory Visit: Payer: Self-pay | Admitting: Family Medicine

## 2018-01-31 DIAGNOSIS — H401131 Primary open-angle glaucoma, bilateral, mild stage: Secondary | ICD-10-CM | POA: Diagnosis not present

## 2018-02-08 DIAGNOSIS — H401131 Primary open-angle glaucoma, bilateral, mild stage: Secondary | ICD-10-CM | POA: Diagnosis not present

## 2018-04-15 ENCOUNTER — Telehealth: Payer: Self-pay | Admitting: Family Medicine

## 2018-04-15 ENCOUNTER — Ambulatory Visit (INDEPENDENT_AMBULATORY_CARE_PROVIDER_SITE_OTHER): Payer: PPO

## 2018-04-15 VITALS — BP 138/84 | HR 68 | Temp 97.9°F | Ht 69.5 in | Wt 189.5 lb

## 2018-04-15 DIAGNOSIS — Z0000 Encounter for general adult medical examination without abnormal findings: Secondary | ICD-10-CM

## 2018-04-15 DIAGNOSIS — R7303 Prediabetes: Secondary | ICD-10-CM | POA: Diagnosis not present

## 2018-04-15 DIAGNOSIS — I10 Essential (primary) hypertension: Secondary | ICD-10-CM

## 2018-04-15 DIAGNOSIS — R972 Elevated prostate specific antigen [PSA]: Secondary | ICD-10-CM

## 2018-04-15 LAB — COMPREHENSIVE METABOLIC PANEL
ALBUMIN: 4.1 g/dL (ref 3.5–5.2)
ALK PHOS: 55 U/L (ref 39–117)
ALT: 15 U/L (ref 0–53)
AST: 15 U/L (ref 0–37)
BILIRUBIN TOTAL: 0.7 mg/dL (ref 0.2–1.2)
BUN: 18 mg/dL (ref 6–23)
CO2: 32 mEq/L (ref 19–32)
CREATININE: 1.13 mg/dL (ref 0.40–1.50)
Calcium: 9.3 mg/dL (ref 8.4–10.5)
Chloride: 104 mEq/L (ref 96–112)
GFR: 81.18 mL/min (ref >60.00)
Glucose, Bld: 94 mg/dL (ref 70–99)
Potassium: 4.3 mEq/L (ref 3.5–5.1)
SODIUM: 142 meq/L (ref 135–145)
Total Protein: 7.5 g/dL (ref 6.0–8.3)

## 2018-04-15 LAB — HEMOGLOBIN A1C: Hgb A1c MFr Bld: 5.9 % (ref 4.6–6.5)

## 2018-04-15 LAB — LIPID PANEL
CHOL/HDL RATIO: 4
Cholesterol: 163 mg/dL (ref 0–200)
HDL: 38.7 mg/dL — AB (ref >39.00)
LDL CALC: 110 mg/dL — AB (ref 0–99)
NonHDL: 124.76
TRIGLYCERIDES: 74 mg/dL (ref 0.0–149.0)
VLDL: 14.8 mg/dL (ref 0.0–40.0)

## 2018-04-15 NOTE — Telephone Encounter (Signed)
-----   Message from Eustace Pen, LPN sent at 28/12/6809  3:22 PM EST ----- Regarding: Labs 11/8 Lab orders needed. Thank you.  Insurance:  [employer]

## 2018-04-15 NOTE — Progress Notes (Signed)
Subjective:   Bobby Simpson is a 77 y.o. male who presents for Medicare Annual/Subsequent preventive examination.  Review of Systems:  N/A Cardiac Risk Factors include: advanced age (>40men, >1 women);male gender;hypertension     Objective:    Vitals: BP 138/84 (BP Location: Right Arm, Patient Position: Sitting, Cuff Size: Normal)   Pulse 68   Temp 97.9 F (36.6 C) (Oral)   Ht 5' 9.5" (1.765 m) Comment: shoes  Wt 189 lb 8 oz (86 kg)   SpO2 98%   BMI 27.58 kg/m   Body mass index is 27.58 kg/m.  Advanced Directives 04/15/2018 04/12/2017 12/17/2015  Does Patient Have a Medical Advance Directive? No No No  Would patient like information on creating a medical advance directive? No - Patient declined - No - patient declined information    Tobacco Social History   Tobacco Use  Smoking Status Former Smoker  . Packs/day: 0.25  . Years: 5.00  . Pack years: 1.25  . Types: Cigarettes  Smokeless Tobacco Former Systems developer  . Quit date: 06/17/1960     Counseling given: No   Clinical Intake:  Pre-visit preparation completed: Yes  Pain : No/denies pain Pain Score: 0-No pain     Nutritional Status: BMI 25 -29 Overweight Nutritional Risks: None Diabetes: No  How often do you need to have someone help you when you read instructions, pamphlets, or other written materials from your doctor or pharmacy?: 1 - Never What is the last grade level you completed in school?: 12th grade  Interpreter Needed?: No  Comments: pt lives with spouse Information entered by :: LPinson, LPN  Past Medical History:  Diagnosis Date  . Glaucoma   . History of chicken pox   . Hypertension    Past Surgical History:  Procedure Laterality Date  . CATARACT EXTRACTION, BILATERAL     Family History  Problem Relation Age of Onset  . Cancer Father        unsure type  . Asthma Brother    Social History   Socioeconomic History  . Marital status: Married    Spouse name: Not on file  . Number of  children: Not on file  . Years of education: Not on file  . Highest education level: Not on file  Occupational History  . Not on file  Social Needs  . Financial resource strain: Not on file  . Food insecurity:    Worry: Not on file    Inability: Not on file  . Transportation needs:    Medical: Not on file    Non-medical: Not on file  Tobacco Use  . Smoking status: Former Smoker    Packs/day: 0.25    Years: 5.00    Pack years: 1.25    Types: Cigarettes  . Smokeless tobacco: Former Systems developer    Quit date: 06/17/1960  Substance and Sexual Activity  . Alcohol use: No    Alcohol/week: 0.0 standard drinks    Comment: .25  . Drug use: No  . Sexual activity: Yes  Lifestyle  . Physical activity:    Days per week: Not on file    Minutes per session: Not on file  . Stress: Not on file  Relationships  . Social connections:    Talks on phone: Not on file    Gets together: Not on file    Attends religious service: Not on file    Active member of club or organization: Not on file    Attends meetings of clubs  or organizations: Not on file    Relationship status: Not on file  Other Topics Concern  . Not on file  Social History Narrative   Married, 3 grown kids    Born in [place], been here since 1964    Outpatient Encounter Medications as of 04/15/2018  Medication Sig  . latanoprost (XALATAN) 0.005 % ophthalmic solution Place 1 drop into both eyes at bedtime.   Marland Kitchen lisinopril-hydrochlorothiazide (PRINZIDE,ZESTORETIC) 20-12.5 MG tablet TAKE 1 TABLET BY MOUTH ONCE DAILY  . Multiple Vitamins-Minerals (ONE-A-DAY 50 PLUS PO) Take 1 tablet by mouth daily.  . timolol (TIMOPTIC) 0.5 % ophthalmic solution Place 1 drop into both eyes daily.    No facility-administered encounter medications on file as of 04/15/2018.     Activities of Daily Living In your present state of health, do you have any difficulty performing the following activities: 04/15/2018  Hearing? N  Vision? N  Difficulty  concentrating or making decisions? N  Walking or climbing stairs? N  Dressing or bathing? N  Doing errands, shopping? N  Preparing Food and eating ? N  Using the Toilet? N  In the past six months, have you accidently leaked urine? N  Do you have problems with loss of bowel control? N  Managing your Medications? N  Managing your Finances? N  Housekeeping or managing your Housekeeping? N  Some recent data might be hidden    Patient Care Team: Jinny Sanders, MD as PCP - General (Family Medicine) Karren Burly Deirdre Peer, MD as Referring Physician (Ophthalmology)   Assessment:   This is a routine wellness examination for Bobby Simpson.   Hearing Screening   125Hz  250Hz  500Hz  1000Hz  2000Hz  3000Hz  4000Hz  6000Hz  8000Hz   Right ear:   40 0 40  0    Left ear:   40 40 40  40    Vision Screening Comments: Vision exam in Sept 2019   Exercise Activities and Dietary recommendations Current Exercise Habits: Home exercise routine, Type of exercise: walking, Time (Minutes): 35, Frequency (Times/Week): 7, Weekly Exercise (Minutes/Week): 245, Intensity: Mild, Exercise limited by: None identified  Goals    . Increase physical activity     When weather permits, I will continue to walk at least 2 miles daily.        Fall Risk Fall Risk  04/15/2018 04/12/2017 12/17/2015 12/20/2014  Falls in the past year? 0 No No No   Depression Screen PHQ 2/9 Scores 04/15/2018 04/12/2017 12/17/2015 12/20/2014  PHQ - 2 Score 0 0 0 0  PHQ- 9 Score 0 0 - -    Cognitive Function MMSE - Mini Mental State Exam 04/15/2018 04/12/2017 12/17/2015  Orientation to time 5 5 5   Orientation to Place 5 5 5   Registration 3 3 3   Attention/ Calculation 0 0 0  Recall 3 3 3   Language- name 2 objects 0 0 0  Language- repeat 1 1 1   Language- follow 3 step command 3 3 3   Language- read & follow direction 0 0 0  Write a sentence 0 0 0  Copy design 0 0 0  Total score 20 20 20      PLEASE NOTE: A Mini-Cog screen was completed. Maximum  score is 20. A value of 0 denotes this part of Folstein MMSE was not completed or the patient failed this part of the Mini-Cog screening.   Mini-Cog Screening Orientation to Time - Max 5 pts Orientation to Place - Max 5 pts Registration - Max 3 pts Recall - Max 3 pts  Language Repeat - Max 1 pts Language Follow 3 Step Command - Max 3 pts      There is no immunization history on file for this patient.  Screening Tests Health Maintenance  Topic Date Due  . INFLUENZA VACCINE  09/07/2018 (Originally 01/06/2018)  . TETANUS/TDAP  12/16/2025 (Originally 06/26/1961)  . PNA vac Low Risk Adult (1 of 2 - PCV13) 12/16/2025 (Originally 06/27/2007)  . Fecal DNA (Cologuard)  12/22/2018       Plan:     I have personally reviewed, addressed, and noted the following in the patient's chart:  A. Medical and social history B. Use of alcohol, tobacco or illicit drugs  C. Current medications and supplements D. Functional ability and status E.  Nutritional status F.  Physical activity G. Advance directives H. List of other physicians I.  Hospitalizations, surgeries, and ER visits in previous 12 months J.  [place] to include hearing, vision, cognitive, depression L. Referrals and appointments - none  In addition, I have reviewed and discussed with patient certain preventive protocols, quality metrics, and best practice recommendations. A written personalized care plan for preventive services as well as general preventive health recommendations were provided to patient.  See attached scanned questionnaire for additional information.   Signed,   Lindell Noe, MHA, BS, LPN Health Coach

## 2018-04-15 NOTE — Progress Notes (Signed)
I reviewed health advisor's note, was available for consultation, and agree with documentation and plan.  

## 2018-04-15 NOTE — Progress Notes (Signed)
PCP notes:   Health maintenance:  Flu vaccine - pt declined  Abnormal screenings:   Hearing - failed  Hearing Screening   125Hz  250Hz  500Hz  1000Hz  2000Hz  3000Hz  4000Hz  6000Hz  8000Hz   Right ear:   40 0 40  0    Left ear:   40 40 40  40     Patient concerns:   None  Nurse concerns:  None  Next PCP appt:   04/19/18 @ 1115

## 2018-04-15 NOTE — Patient Instructions (Signed)
Bobby Simpson , Thank you for taking time to come for your Medicare Wellness Visit. I appreciate your ongoing commitment to your health goals. Please review the following plan we discussed and let me know if I can assist you in the future.   These are the goals we discussed: Goals    . Increase physical activity     When weather permits, I will continue to walk at least 2 miles daily.        This is a list of the screening recommended for you and due dates:  Health Maintenance  Topic Date Due  . Flu Shot  09/07/2018*  . Tetanus Vaccine  12/16/2025*  . Pneumonia vaccines (1 of 2 - PCV13) 12/16/2025*  . Cologuard (Stool DNA test)  12/22/2018  *Topic was postponed. The date shown is not the original due date.   Preventive Care for Adults  A healthy lifestyle and preventive care can promote health and wellness. Preventive health guidelines for adults include the following key practices.  . A routine yearly physical is a good way to check with your health care provider about your health and preventive screening. It is a chance to share any concerns and updates on your health and to receive a thorough exam.  . Visit your dentist for a routine exam and preventive care every 6 months. Brush your teeth twice a day and floss once a day. Good oral hygiene prevents tooth decay and gum disease.  . The frequency of eye exams is based on your age, health, family medical history, use  of contact lenses, and other factors. Follow your health care provider's recommendations for frequency of eye exams.  . Eat a healthy diet. Foods like vegetables, fruits, whole grains, low-fat dairy products, and lean protein foods contain the nutrients you need without too many calories. Decrease your intake of foods high in solid fats, added sugars, and salt. Eat the right amount of calories for you. Get information about a proper diet from your health care provider, if necessary.  . Regular physical exercise is one  of the most important things you can do for your health. Most adults should get at least 150 minutes of moderate-intensity exercise (any activity that increases your heart rate and causes you to sweat) each week. In addition, most adults need muscle-strengthening exercises on 2 or more days a week.  Silver Sneakers may be a benefit available to you. To determine eligibility, you may visit the website: www.silversneakers.com or contact program at 310 529 7847 Mon-Fri between 8AM-8PM.   . Maintain a healthy weight. The body mass index (BMI) is a screening tool to identify possible weight problems. It provides an estimate of body fat based on height and weight. Your health care provider can find your BMI and can help you achieve or maintain a healthy weight.   For adults 20 years and older: ? A BMI below 18.5 is considered underweight. ? A BMI of 18.5 to 24.9 is normal. ? A BMI of 25 to 29.9 is considered overweight. ? A BMI of 30 and above is considered obese.   . Maintain normal blood lipids and cholesterol levels by exercising and minimizing your intake of saturated fat. Eat a balanced diet with plenty of fruit and vegetables. Blood tests for lipids and cholesterol should begin at age 28 and be repeated every 5 years. If your lipid or cholesterol levels are high, you are over 50, or you are at high risk for heart disease, you may need your  cholesterol levels checked more frequently. Ongoing high lipid and cholesterol levels should be treated with medicines if diet and exercise are not working.  . If you smoke, find out from your health care provider how to quit. If you do not use tobacco, please do not start.  . If you choose to drink alcohol, please do not consume more than 2 drinks per day. One drink is considered to be 12 ounces (355 mL) of beer, 5 ounces (148 mL) of wine, or 1.5 ounces (44 mL) of liquor.  . If you are 34-16 years old, ask your health care provider if you should take aspirin  to prevent strokes.  . Use sunscreen. Apply sunscreen liberally and repeatedly throughout the day. You should seek shade when your shadow is shorter than you. Protect yourself by wearing long sleeves, pants, a wide-brimmed hat, and sunglasses year round, whenever you are outdoors.  . Once a month, do a whole body skin exam, using a mirror to look at the skin on your back. Tell your health care provider of new moles, moles that have irregular borders, moles that are larger than a pencil eraser, or moles that have changed in shape or color.

## 2018-04-19 ENCOUNTER — Ambulatory Visit (INDEPENDENT_AMBULATORY_CARE_PROVIDER_SITE_OTHER): Payer: PPO | Admitting: Family Medicine

## 2018-04-19 ENCOUNTER — Encounter: Payer: Self-pay | Admitting: Family Medicine

## 2018-04-19 VITALS — BP 109/66 | HR 70 | Temp 98.6°F | Ht 69.5 in | Wt 191.5 lb

## 2018-04-19 DIAGNOSIS — Z0000 Encounter for general adult medical examination without abnormal findings: Secondary | ICD-10-CM | POA: Diagnosis not present

## 2018-04-19 DIAGNOSIS — I10 Essential (primary) hypertension: Secondary | ICD-10-CM | POA: Diagnosis not present

## 2018-04-19 DIAGNOSIS — R972 Elevated prostate specific antigen [PSA]: Secondary | ICD-10-CM | POA: Diagnosis not present

## 2018-04-19 DIAGNOSIS — R7303 Prediabetes: Secondary | ICD-10-CM

## 2018-04-19 DIAGNOSIS — D400 Neoplasm of uncertain behavior of prostate: Secondary | ICD-10-CM

## 2018-04-19 NOTE — Progress Notes (Signed)
Subjective:    Patient ID: Bobby Simpson, male    DOB: 03/09/1941, 77 y.o.   MRN: [id number]  HPI The patient presents for  complete physical and review of chronic health problems. He/She also has the following acute concerns today:  The patient saw Candis Musa, LPN for medicare wellness. Note reviewed in detail and important notes copied below.   Health maintenance:  Flu vaccine - pt declined  Abnormal screenings:   Hearing - failed... He has noted hearing loss from past job in right ear.             Hearing Screening   125Hz  250Hz  500Hz  1000Hz  2000Hz  3000Hz  4000Hz  6000Hz  8000Hz   Right ear:   40 0 40  0    Left ear:   40 40 40  40     Patient concerns:  None   04/19/18 today  Hypertension:   Borderline control in office today  But did not take BP med today. But well controlled at recent visit and at home. BP Readings from Last 3 Encounters:  04/19/18 140/80  04/15/18 138/84  04/16/17 110/60  Using medication without problems or lightheadedness:  none Chest pain with exertion: none Edema:none Short of breath: none Average home BPs: Other issues:  Lab Results  Component Value Date   HGBA1C 5.9 04/15/2018    Social History /Family History/Past Medical History reviewed in detail and updated in EMR if needed. Blood pressure 140/80, pulse 70, temperature 98.6 F (37 C), temperature source Oral, height 5' 9.5" (1.765 m), weight 191 lb 8 oz (86.9 kg).  Review of Systems  Constitutional: Negative for fatigue and fever.  HENT: Negative for ear pain.   Eyes: Negative for pain.  Respiratory: Negative for cough and shortness of breath.   Cardiovascular: Negative for chest pain, palpitations and leg swelling.  Gastrointestinal: Negative for abdominal pain.  Genitourinary: Negative for dysuria.  Musculoskeletal: Negative for arthralgias.  Neurological: Negative for syncope, light-headedness and headaches.  Psychiatric/Behavioral: Negative for  dysphoric mood.       Objective:   Physical Exam  Constitutional: He appears well-developed and well-nourished.  Non-toxic appearance. He does not appear ill. No distress.  HENT:  Head: Normocephalic and atraumatic.  Right Ear: Hearing, tympanic membrane, external ear and ear canal normal.  Left Ear: Hearing, tympanic membrane, external ear and ear canal normal.  Nose: Nose normal.  Mouth/Throat: Uvula is midline, oropharynx is clear and moist and mucous membranes are normal.  Eyes: Pupils are equal, round, and reactive to light. Conjunctivae, EOM and lids are normal. Lids are everted and swept, no foreign bodies found.  Neck: Trachea normal, normal range of motion and phonation normal. Neck supple. Carotid bruit is not present. No thyroid mass and no thyromegaly present.  Cardiovascular: Normal rate, regular rhythm, S1 normal, S2 normal, intact distal pulses and normal pulses. Exam reveals no gallop.  No murmur heard. Pulmonary/Chest: Breath sounds normal. He has no wheezes. He has no rhonchi. He has no rales.  Abdominal: Soft. Normal appearance and bowel sounds are normal. There is no hepatosplenomegaly. There is no tenderness. There is no rebound, no guarding and no CVA tenderness. No hernia.  Lymphadenopathy:    He has no cervical adenopathy.  Neurological: He is alert. He has normal strength and normal reflexes. No cranial nerve deficit or sensory deficit. Gait normal.  Skin: Skin is warm, dry and intact. No rash noted.  Psychiatric: He has a normal mood and affect. His speech is normal and  behavior is normal. Judgment normal.       Assessment & Plan:  The patient's preventative maintenance and recommended screening tests for an annual wellness exam were reviewed in full today. Brought up to date unless services declined.  Counselled on the importance of diet, exercise, and its role in overall health and mortality. The patient's FH and SH was reviewed, including their home life,  tobacco status, and drug and alcohol status.   Vaccines: He is not interested in any vaccines. Colon: Cologuard Negative 2017, due 2020 Prostate: per uro [facility], He refused repeat biopsy. He has decided (Reviewed uro note) that he does not want any further PSAs. Pt agrees again 04/2018.Marland Kitchen No further eval and treatment wanted. nonsmoker

## 2018-04-19 NOTE — Assessment & Plan Note (Addendum)
Well controlled whren taking medicaiton.. Noncompliant today. Continue current medication. Encouraged exercise, weight loss, healthy eating habits.

## 2018-04-19 NOTE — Assessment & Plan Note (Addendum)
Pt continue to not want further eval of PSA or prostate exam or referral back to urology.  he voices understanding of possible prostate cancer and risk of metastasis, complicaitons and death associated

## 2018-04-19 NOTE — Assessment & Plan Note (Signed)
Stable control. 

## 2018-04-19 NOTE — Patient Instructions (Addendum)
Make sure to take BP medicaiton daily.  Increase exercise as able, eat veggies fats in place of animal fats.

## 2018-08-15 DIAGNOSIS — H401131 Primary open-angle glaucoma, bilateral, mild stage: Secondary | ICD-10-CM | POA: Diagnosis not present

## 2018-09-19 ENCOUNTER — Other Ambulatory Visit: Payer: Self-pay | Admitting: Family Medicine

## 2019-02-16 DIAGNOSIS — H401131 Primary open-angle glaucoma, bilateral, mild stage: Secondary | ICD-10-CM | POA: Diagnosis not present

## 2019-02-28 DIAGNOSIS — H401131 Primary open-angle glaucoma, bilateral, mild stage: Secondary | ICD-10-CM | POA: Diagnosis not present

## 2019-04-17 ENCOUNTER — Telehealth: Payer: Self-pay

## 2019-04-17 NOTE — Telephone Encounter (Signed)
LVM w COVID screen, front door and back door info 11.9.2020 TLJ

## 2019-04-20 ENCOUNTER — Other Ambulatory Visit (INDEPENDENT_AMBULATORY_CARE_PROVIDER_SITE_OTHER): Payer: PPO

## 2019-04-20 ENCOUNTER — Telehealth: Payer: Self-pay | Admitting: Family Medicine

## 2019-04-20 ENCOUNTER — Other Ambulatory Visit: Payer: Self-pay

## 2019-04-20 ENCOUNTER — Ambulatory Visit: Payer: PPO

## 2019-04-20 ENCOUNTER — Ambulatory Visit (INDEPENDENT_AMBULATORY_CARE_PROVIDER_SITE_OTHER): Payer: PPO

## 2019-04-20 VITALS — BP 127/80

## 2019-04-20 DIAGNOSIS — R7303 Prediabetes: Secondary | ICD-10-CM

## 2019-04-20 DIAGNOSIS — Z0000 Encounter for general adult medical examination without abnormal findings: Secondary | ICD-10-CM

## 2019-04-20 LAB — COMPREHENSIVE METABOLIC PANEL
ALT: 19 U/L (ref 0–53)
AST: 16 U/L (ref 0–37)
Albumin: 4.5 g/dL (ref 3.5–5.2)
Alkaline Phosphatase: 66 U/L (ref 39–117)
BUN: 25 mg/dL — ABNORMAL HIGH (ref 6–23)
CO2: 31 mEq/L (ref 19–32)
Calcium: 9.4 mg/dL (ref 8.4–10.5)
Chloride: 102 mEq/L (ref 96–112)
Creatinine, Ser: 1.18 mg/dL (ref 0.40–1.50)
GFR: 72.46 mL/min (ref >60.00)
Glucose, Bld: 100 mg/dL — ABNORMAL HIGH (ref 70–99)
Potassium: 4.6 mEq/L (ref 3.5–5.1)
Sodium: 140 mEq/L (ref 135–145)
Total Bilirubin: 0.8 mg/dL (ref 0.2–1.2)
Total Protein: 7.7 g/dL (ref 6.0–8.3)

## 2019-04-20 LAB — LIPID PANEL
Cholesterol: 193 mg/dL (ref 0–200)
HDL: 43.1 mg/dL (ref >39.00)
LDL Cholesterol: 130 mg/dL — ABNORMAL HIGH (ref 0–99)
NonHDL: 149.71
Total CHOL/HDL Ratio: 4
Triglycerides: 98 mg/dL (ref 0.0–149.0)
VLDL: 19.6 mg/dL (ref 0.0–40.0)

## 2019-04-20 LAB — HEMOGLOBIN A1C: Hgb A1c MFr Bld: 5.9 % (ref 4.6–6.5)

## 2019-04-20 NOTE — Progress Notes (Signed)
PCP notes:  Health Maintenance: Declined all vaccines Declined colonoscopy/cologuard   Abnormal Screenings: none   Patient concerns: none   Nurse concerns: none   Next PCP appt.: 04/28/2019 @ 3:40 pm

## 2019-04-20 NOTE — Progress Notes (Signed)
Subjective:   Bobby Simpson is a 78 y.o. male who presents for Medicare Annual/Subsequent preventive examination.  Review of Systems: N/A   This visit is being conducted through telemedicine via telephone at the nurse health advisor's home address due to the COVID-19 pandemic. This patient has given me verbal consent via doximity to conduct this visit, patient states they are participating from their home address. Patient and myself are on the telephone call. There is no referral for this visit. Some vital signs may be absent or patient reported.    Patient identification: identified by name, DOB, and current address   Cardiac Risk Factors include: advanced age (>33men, >3 women);male gender;hypertension     Objective:    Vitals: BP 127/80   There is no height or weight on file to calculate BMI.  Advanced Directives 04/20/2019 04/15/2018 04/12/2017 12/17/2015  Does Patient Have a Medical Advance Directive? No No No No  Would patient like information on creating a medical advance directive? No - Patient declined No - Patient declined - No - patient declined information    Tobacco Social History   Tobacco Use  Smoking Status Former Smoker  . Packs/day: 0.25  . Years: 5.00  . Pack years: 1.25  . Types: Cigarettes  Smokeless Tobacco Former Systems developer  . Quit date: 06/17/1960     Counseling given: Not Answered   Clinical Intake:  Pre-visit preparation completed: Yes  Pain : No/denies pain     Nutritional Risks: None Diabetes: No  How often do you need to have someone help you when you read instructions, pamphlets, or other written materials from your doctor or pharmacy?: 1 - Never What is the last grade level you completed in school?: went to school in [place], does not know last grade completed  Interpreter Needed?: No  Information entered by :: CJohnson, LPN  Past Medical History:  Diagnosis Date  . Glaucoma   . History of chicken pox   . Hypertension    Past  Surgical History:  Procedure Laterality Date  . CATARACT EXTRACTION, BILATERAL     Family History  Problem Relation Age of Onset  . Cancer Father        unsure type  . Asthma Brother    Social History   Socioeconomic History  . Marital status: Married    Spouse name: Not on file  . Number of children: Not on file  . Years of education: Not on file  . Highest education level: Not on file  Occupational History  . Not on file  Social Needs  . Financial resource strain: Not hard at all  . Food insecurity    Worry: Never true    Inability: Never true  . Transportation needs    Medical: No    Non-medical: No  Tobacco Use  . Smoking status: Former Smoker    Packs/day: 0.25    Years: 5.00    Pack years: 1.25    Types: Cigarettes  . Smokeless tobacco: Former Systems developer    Quit date: 06/17/1960  Substance and Sexual Activity  . Alcohol use: No    Alcohol/week: 0.0 standard drinks    Comment: .25  . Drug use: No  . Sexual activity: Yes  Lifestyle  . Physical activity    Days per week: 0 days    Minutes per session: 0 min  . Stress: Not at all  Relationships  . Social connections    Talks on phone: Not on file  Gets together: Not on file    Attends religious service: Not on file    Active member of club or organization: Not on file    Attends meetings of clubs or organizations: Not on file    Relationship status: Not on file  Other Topics Concern  . Not on file  Social History Narrative   Married, 3 grown kids    Born in [place], been here since 1964    Outpatient Encounter Medications as of 04/20/2019  Medication Sig  . latanoprost (XALATAN) 0.005 % ophthalmic solution Place 1 drop into both eyes at bedtime.   Marland Kitchen lisinopril-hydrochlorothiazide (PRINZIDE,ZESTORETIC) 20-12.5 MG tablet Take 1 tablet by mouth once daily  . Multiple Vitamins-Minerals (ONE-A-DAY 50 PLUS PO) Take 1 tablet by mouth daily.  . timolol (TIMOPTIC) 0.5 % ophthalmic solution Place 1 drop into both  eyes daily.    No facility-administered encounter medications on file as of 04/20/2019.     Activities of Daily Living In your present state of health, do you have any difficulty performing the following activities: 04/20/2019  Hearing? Y  Comment has to turn the TV up sometimes  Vision? N  Difficulty concentrating or making decisions? N  Walking or climbing stairs? N  Dressing or bathing? N  Doing errands, shopping? N  Preparing Food and eating ? N  Using the Toilet? N  In the past six months, have you accidently leaked urine? N  Do you have problems with loss of bowel control? N  Managing your Medications? N  Managing your Finances? N  Housekeeping or managing your Housekeeping? N  Some recent data might be hidden    Patient Care Team: Jinny Sanders, MD as PCP - General (Family Medicine) Karren Burly Deirdre Peer, MD as Referring Physician (Ophthalmology)   Assessment:   This is a routine wellness examination for Bobby Simpson.  Exercise Activities and Dietary recommendations Current Exercise Habits: Home exercise routine, Type of exercise: walking, Time (Minutes): 45, Frequency (Times/Week): 7, Weekly Exercise (Minutes/Week): 315, Intensity: Mild, Exercise limited by: None identified  Goals    . Increase physical activity     When weather permits, I will continue to walk at least 2 miles daily.     . Patient Stated     04/20/2019, I will maintain and continue medications as prescribed.        Fall Risk Fall Risk  04/20/2019 04/15/2018 04/12/2017 12/17/2015 12/20/2014  Falls in the past year? 0 0 No No No  Number falls in past yr: 0 - - - -  Injury with Fall? 0 - - - -  Risk for fall due to : Medication side effect - - - -  Follow up Falls evaluation completed;Falls prevention discussed - - - -   Is the patient's home free of loose throw rugs in walkways, pet beds, electrical cords, etc?   yes      Grab bars in the bathroom? no      Handrails on the stairs?   yes       Adequate lighting?   yes  Timed Get Up and Go Performed: N/A  Depression Screen PHQ 2/9 Scores 04/20/2019 04/15/2018 04/12/2017 12/17/2015  PHQ - 2 Score 0 0 0 0  PHQ- 9 Score 0 0 0 -    Cognitive Function MMSE - Mini Mental State Exam 04/20/2019 04/15/2018 04/12/2017 12/17/2015  Orientation to time 5 5 5 5   Orientation to Place 5 5 5 5   Registration 3 3 3 3   Attention/  Calculation 5 0 0 0  Recall 3 3 3 3   Language- name 2 objects - 0 0 0  Language- repeat 1 1 1 1   Language- follow 3 step command - 3 3 3   Language- read & follow direction - 0 0 0  Write a sentence - 0 0 0  Copy design - 0 0 0  Total score - 20 20 20   Mini Cog  Mini-Cog screen was completed. Maximum score is 22. A value of 0 denotes this part of the MMSE was not completed or the patient failed this part of the Mini-Cog screening.        There is no immunization history on file for this patient.  Qualifies for Shingles Vaccine? Yes, declines  Screening Tests Health Maintenance  Topic Date Due  . INFLUENZA VACCINE  09/06/2019 (Originally 01/07/2019)  . TETANUS/TDAP  12/16/2025 (Originally 06/26/1961)  . PNA vac Low Risk Adult (1 of 2 - PCV13) 12/16/2025 (Originally 06/27/2007)   Cancer Screenings: Lung: Low Dose CT Chest recommended if Age 76-80 years, 30 pack-year currently smoking OR have quit w/in 15years. Patient does not qualify. Colorectal: declined  Additional Screenings:  Hepatitis C Screening: N/A      Plan:    Patient will maintain and continue medications as prescribed.   I have personally reviewed and noted the following in the patient's chart:   . Medical and social history . Use of alcohol, tobacco or illicit drugs  . Current medications and supplements . Functional ability and status . Nutritional status . Physical activity . Advanced directives . List of other physicians . Hospitalizations, surgeries, and ER visits in previous 12 months . Vitals . Screenings to include cognitive,  depression, and falls . Referrals and appointments  In addition, I have reviewed and discussed with patient certain preventive protocols, quality metrics, and best practice recommendations. A written personalized care plan for preventive services as well as general preventive health recommendations were provided to patient.     Andrez Grime, LPN  [phone]

## 2019-04-20 NOTE — Progress Notes (Signed)
I reviewed health advisor's note, was available for consultation, and agree with documentation and plan.  

## 2019-04-20 NOTE — Telephone Encounter (Signed)
-----   Message from Ellamae Sia sent at 04/13/2019 12:38 PM EST ----- Regarding: lab orders for Thursday, 11.12.20 Patient is scheduled for CPX labs, please order future labs, Thanks , Karna Christmas

## 2019-04-20 NOTE — Progress Notes (Signed)
No critical labs need to be addressed urgently. We will discuss labs in detail at upcoming office visit.   

## 2019-04-20 NOTE — Patient Instructions (Signed)
Bobby Simpson , Thank you for taking time to come for your Medicare Wellness Visit. I appreciate your ongoing commitment to your health goals. Please review the following plan we discussed and let me know if I can assist you in the future.   Screening recommendations/referrals: Colonoscopy: declined Recommended yearly ophthalmology/optometry visit for glaucoma screening and checkup Recommended yearly dental visit for hygiene and checkup  Vaccinations: Influenza vaccine: decline Pneumococcal vaccine: decline Tdap vaccine: decline Shingles vaccine: decline    Advanced directives: Advance directive discussed with you today. Even though you declined this today please call our office should you change your mind and we can give you the proper paperwork for you to fill out.  Conditions/risks identified: hypertension  Next appointment: 04/28/2019 @ 3:40 pm   Preventive Care 65 Years and Older, Male Preventive care refers to lifestyle choices and visits with your health care provider that can promote health and wellness. What does preventive care include?  A yearly physical exam. This is also called an annual well check.  Dental exams once or twice a year.  Routine eye exams. Ask your health care provider how often you should have your eyes checked.  Personal lifestyle choices, including:  Daily care of your teeth and gums.  Regular physical activity.  Eating a healthy diet.  Avoiding tobacco and drug use.  Limiting alcohol use.  Practicing safe sex.  Taking low doses of aspirin every day.  Taking vitamin and mineral supplements as recommended by your health care provider. What happens during an annual well check? The services and screenings done by your health care provider during your annual well check will depend on your age, overall health, lifestyle risk factors, and family history of disease. Counseling  Your health care provider may ask you questions about your:   Alcohol use.  Tobacco use.  Drug use.  Emotional well-being.  Home and relationship well-being.  Sexual activity.  Eating habits.  History of falls.  Memory and ability to understand (cognition).  Work and work Statistician. Screening  You may have the following tests or measurements:  Height, weight, and BMI.  Blood pressure.  Lipid and cholesterol levels. These may be checked every 5 years, or more frequently if you are over 5 years old.  Skin check.  Lung cancer screening. You may have this screening every year starting at age 53 if you have a 30-pack-year history of smoking and currently smoke or have quit within the past 15 years.  Fecal occult blood test (FOBT) of the stool. You may have this test every year starting at age 34.  Flexible sigmoidoscopy or colonoscopy. You may have a sigmoidoscopy every 5 years or a colonoscopy every 10 years starting at age 44.  Prostate cancer screening. Recommendations will vary depending on your family history and other risks.  Hepatitis C blood test.  Hepatitis B blood test.  Sexually transmitted disease (STD) testing.  Diabetes screening. This is done by checking your blood sugar (glucose) after you have not eaten for a while (fasting). You may have this done every 1-3 years.  Abdominal aortic aneurysm (AAA) screening. You may need this if you are a current or former smoker.  Osteoporosis. You may be screened starting at age 20 if you are at high risk. Talk with your health care provider about your test results, treatment options, and if necessary, the need for more tests. Vaccines  Your health care provider may recommend certain vaccines, such as:  Influenza vaccine. This is recommended every  year.  Tetanus, diphtheria, and acellular pertussis (Tdap, Td) vaccine. You may need a Td booster every 10 years.  Zoster vaccine. You may need this after age 42.  Pneumococcal 13-valent conjugate (PCV13) vaccine. One dose is  recommended after age 19.  Pneumococcal polysaccharide (PPSV23) vaccine. One dose is recommended after age 72. Talk to your health care provider about which screenings and vaccines you need and how often you need them. This information is not intended to replace advice given to you by your health care provider. Make sure you discuss any questions you have with your health care provider. Document Released: 06/21/2015 Document Revised: 02/12/2016 Document Reviewed: 03/26/2015 Elsevier Interactive Patient Education  2017 [employer] Prevention in the Home Falls can cause injuries. They can happen to people of all ages. There are many things you can do to make your home safe and to help prevent falls. What can I do on the outside of my home?  Regularly fix the edges of walkways and driveways and fix any cracks.  Remove anything that might make you trip as you walk through a door, such as a raised step or threshold.  Trim any bushes or trees on the path to your home.  Use bright outdoor lighting.  Clear any walking paths of anything that might make someone trip, such as rocks or tools.  Regularly check to see if handrails are loose or broken. Make sure that both sides of any steps have handrails.  Any raised decks and porches should have guardrails on the edges.  Have any leaves, snow, or ice cleared regularly.  Use sand or salt on walking paths during winter.  Clean up any spills in your garage right away. This includes oil or grease spills. What can I do in the bathroom?  Use night lights.  Install grab bars by the toilet and in the tub and shower. Do not use towel bars as grab bars.  Use non-skid mats or decals in the tub or shower.  If you need to sit down in the shower, use a plastic, non-slip stool.  Keep the floor dry. Clean up any water that spills on the floor as soon as it happens.  Remove soap buildup in the tub or shower regularly.  Attach bath mats  securely with double-sided non-slip rug tape.  Do not have throw rugs and other things on the floor that can make you trip. What can I do in the bedroom?  Use night lights.  Make sure that you have a light by your bed that is easy to reach.  Do not use any sheets or blankets that are too big for your bed. They should not hang down onto the floor.  Have a firm chair that has side arms. You can use this for support while you get dressed.  Do not have throw rugs and other things on the floor that can make you trip. What can I do in the kitchen?  Clean up any spills right away.  Avoid walking on wet floors.  Keep items that you use a lot in easy-to-reach places.  If you need to reach something above you, use a strong step stool that has a grab bar.  Keep electrical cords out of the way.  Do not use floor polish or wax that makes floors slippery. If you must use wax, use non-skid floor wax.  Do not have throw rugs and other things on the floor that can make you trip. What can  I do with my stairs?  Do not leave any items on the stairs.  Make sure that there are handrails on both sides of the stairs and use them. Fix handrails that are broken or loose. Make sure that handrails are as long as the stairways.  Check any carpeting to make sure that it is firmly attached to the stairs. Fix any carpet that is loose or worn.  Avoid having throw rugs at the top or bottom of the stairs. If you do have throw rugs, attach them to the floor with carpet tape.  Make sure that you have a light switch at the top of the stairs and the bottom of the stairs. If you do not have them, ask someone to add them for you. What else can I do to help prevent falls?  Wear shoes that:  Do not have high heels.  Have rubber bottoms.  Are comfortable and fit you well.  Are closed at the toe. Do not wear sandals.  If you use a stepladder:  Make sure that it is fully opened. Do not climb a closed  stepladder.  Make sure that both sides of the stepladder are locked into place.  Ask someone to hold it for you, if possible.  Clearly mark and make sure that you can see:  Any grab bars or handrails.  First and last steps.  Where the edge of each step is.  Use tools that help you move around (mobility aids) if they are needed. These include:  Canes.  Walkers.  Scooters.  Crutches.  Turn on the lights when you go into a dark area. Replace any light bulbs as soon as they burn out.  Set up your furniture so you have a clear path. Avoid moving your furniture around.  If any of your floors are uneven, fix them.  If there are any pets around you, be aware of where they are.  Review your medicines with your doctor. Some medicines can make you feel dizzy. This can increase your chance of falling. Ask your doctor what other things that you can do to help prevent falls. This information is not intended to replace advice given to you by your health care provider. Make sure you discuss any questions you have with your health care provider. Document Released: 03/21/2009 Document Revised: 10/31/2015 Document Reviewed: 06/29/2014 Elsevier Interactive Patient Education  2017 [employer].

## 2019-04-28 ENCOUNTER — Ambulatory Visit (INDEPENDENT_AMBULATORY_CARE_PROVIDER_SITE_OTHER): Payer: PPO | Admitting: Family Medicine

## 2019-04-28 ENCOUNTER — Encounter: Payer: Self-pay | Admitting: Family Medicine

## 2019-04-28 ENCOUNTER — Other Ambulatory Visit: Payer: Self-pay

## 2019-04-28 ENCOUNTER — Encounter: Payer: PPO | Admitting: Family Medicine

## 2019-04-28 VITALS — BP 109/67 | HR 81 | Temp 99.5°F | Ht 68.0 in | Wt 185.0 lb

## 2019-04-28 DIAGNOSIS — Z0000 Encounter for general adult medical examination without abnormal findings: Secondary | ICD-10-CM | POA: Diagnosis not present

## 2019-04-28 DIAGNOSIS — R972 Elevated prostate specific antigen [PSA]: Secondary | ICD-10-CM

## 2019-04-28 DIAGNOSIS — I10 Essential (primary) hypertension: Secondary | ICD-10-CM | POA: Diagnosis not present

## 2019-04-28 DIAGNOSIS — E7800 Pure hypercholesterolemia, unspecified: Secondary | ICD-10-CM | POA: Diagnosis not present

## 2019-04-28 DIAGNOSIS — R7303 Prediabetes: Secondary | ICD-10-CM

## 2019-04-28 NOTE — Patient Instructions (Addendum)
Keep up with activity and exercise. Work on low carbohydrate diet. Water is a better  drink than juice.  Work on low cholesterol diet. Return in 3-6 months for repeat cholesterol check.  Please let me know if you would like to try a cholesterol medication.

## 2019-04-28 NOTE — Assessment & Plan Note (Signed)
Low carb diet 

## 2019-04-28 NOTE — Assessment & Plan Note (Signed)
17.*% 10 year risk.. statin indicated.. pt refused. Work on [employer], Info packet provided.

## 2019-04-28 NOTE — Progress Notes (Signed)
Chief Complaint  Patient presents with  . Annual Exam    Part 2    History of Present Illness: HPI  The patient presents for complete physical and review of chronic health problems. He/She also has the following acute concerns today:none  The patient saw a LPN or RN for medicare wellness visit.  Prevention and wellness was reviewed in detail. Note reviewed and important notes copied below.  Health Maintenance: Declined all vaccines Declined colonoscopy/cologuard Abnormal Screenings: none   Hypertension:   Well controlled on lisinopril HCTZ. BP Readings from Last 3 Encounters:  04/28/19 109/67  04/20/19 127/80  04/19/18 109/66  Using medication without problems or lightheadedness: none Chest pain with exertion:none Edema:none Short of breath:none Average home BPs: Other issues:  Prediabetes  Lab Results  Component Value Date   HGBA1C 5.9 04/20/2019    Elevated PSA.. not interested in further monitoring.. pt understands risk fully.  Elevated Cholesterol: 10 year AHA risk calc 17.8% risk.. statin recommended. Lab Results  Component Value Date   CHOL 193 04/20/2019   HDL 43.10 04/20/2019   LDLCALC 130 (H) 04/20/2019   TRIG 98.0 04/20/2019   CHOLHDL 4 04/20/2019  Using medications without problems: Muscle aches:  Diet compliance: moderate Exercise:walking Other complaints:   This visit occurred during the SARS-CoV-2 public health emergency.  Safety protocols were in place, including screening questions prior to the visit, additional usage of staff PPE, and extensive cleaning of exam room while observing appropriate contact time as indicated for disinfecting solutions.   COVID 19 screen:  No recent travel or known exposure to COVID19 The patient denies respiratory symptoms of COVID 19 at this time. The importance of social distancing was discussed today.     Review of Systems  Constitutional: Negative for chills and fever.  HENT: Negative for congestion  and ear pain.   Eyes: Negative for pain and redness.  Respiratory: Negative for cough and shortness of breath.   Cardiovascular: Negative for chest pain, palpitations and leg swelling.  Gastrointestinal: Negative for abdominal pain, blood in stool, constipation, diarrhea, nausea and vomiting.  Genitourinary: Negative for dysuria.  Musculoskeletal: Negative for falls and myalgias.  Skin: Negative for rash.  Neurological: Negative for dizziness.  Psychiatric/Behavioral: Negative for depression. The patient is not nervous/anxious.       Past Medical History:  Diagnosis Date  . Glaucoma   . History of chicken pox   . Hypertension     reports that he has quit smoking. His smoking use included cigarettes. He has a 1.25 pack-year smoking history. He quit smokeless tobacco use about 58 years ago. He reports that he does not drink alcohol or use drugs.   Current Outpatient Medications:  .  latanoprost (XALATAN) 0.005 % ophthalmic solution, Place 1 drop into both eyes at bedtime. , Disp: , Rfl: 2 .  lisinopril-hydrochlorothiazide (PRINZIDE,ZESTORETIC) 20-12.5 MG tablet, Take 1 tablet by mouth once daily, Disp: 90 tablet, Rfl: 1 .  Multiple Vitamins-Minerals (ONE-A-DAY 50 PLUS PO), Take 1 tablet by mouth daily., Disp: , Rfl:  .  timolol (TIMOPTIC) 0.5 % ophthalmic solution, Place 1 drop into both eyes daily. , Disp: , Rfl: 0   Observations/Objective: Blood pressure 109/67, pulse 81, temperature 99.5 F (37.5 C), temperature source Oral, height 5\' 8"  (1.727 m), weight 185 lb (83.9 kg), SpO2 97 %.  Physical Exam Constitutional:      Appearance: He is well-developed.  HENT:     Head: Normocephalic.     Right Ear: Hearing normal.  Left Ear: Hearing normal.     Nose: Nose normal.  Neck:     Thyroid: No thyroid mass or thyromegaly.     Vascular: No carotid bruit.     Trachea: Trachea normal.  Cardiovascular:     Rate and Rhythm: Normal rate and regular rhythm.     Pulses: Normal pulses.      Heart sounds: Heart sounds not distant. No murmur. No friction rub. No gallop.      Comments: No peripheral edema Pulmonary:     Effort: Pulmonary effort is normal. No respiratory distress.     Breath sounds: Normal breath sounds.  Skin:    General: Skin is warm and dry.     Findings: No rash.  Psychiatric:        Speech: Speech normal.        Behavior: Behavior normal.        Thought Content: Thought content normal.      Assessment and Plan   The patient's preventative maintenance and recommended screening tests for an annual wellness exam were reviewed in full today. Brought up to date unless services declined.  Counselled on the importance of diet, exercise, and its role in overall health and mortality. The patient's FH and SH was reviewed, including their home life, tobacco status, and drug and alcohol status.   Vaccines: He is not interested in any vaccines. Colon: Cologuard Negative 2017, no further indicated Prostate: per uro [facility], He refused repeat biopsy. He has decided (Reviewed uro note) that he does not want any further PSAs. Pt agrees again 04/2019.Marland Kitchen No further eval and treatment wanted. nonsmoker  Hypertension Well controlled. Continue current medication.   Elevated PSA, greater than or equal to 20 ng/ml Pt again refuses further assessment or PSA check.  Hypercholesteremia 17.*% 10 year risk.. statin indicated.. pt refused. Work on [employer], Info packet provided.  Prediabetes Low carb diet.    Eliezer Lofts, MD

## 2019-04-28 NOTE — Assessment & Plan Note (Signed)
Well controlled. Continue current medication.  

## 2019-04-28 NOTE — Assessment & Plan Note (Signed)
Pt again refuses further assessment or PSA check.

## 2019-08-26 ENCOUNTER — Ambulatory Visit: Payer: PPO | Attending: Internal Medicine

## 2019-08-26 DIAGNOSIS — Z23 Encounter for immunization: Secondary | ICD-10-CM

## 2019-08-26 NOTE — Progress Notes (Signed)
   Covid-19 Vaccination Clinic  Name:  Bobby Simpson    MRN: [id number] DOB: [birth year]  08/26/2019  Bobby Simpson was observed post Covid-19 immunization for 15 minutes without incident. He was provided with Vaccine Information Sheet and instruction to access the V-Safe system.   Bobby Simpson was instructed to call 911 with any severe reactions post vaccine: Marland Kitchen Difficulty breathing  . Swelling of face and throat  . A fast heartbeat  . A bad rash all over body  . Dizziness and weakness   Immunizations Administered    Name Date Dose VIS Date Route   Pfizer COVID-19 Vaccine 08/26/2019  9:25 AM 0.3 mL 05/19/2019 Intramuscular   Manufacturer: [place]   Lot: [id number]   [place]: [id number]

## 2019-08-29 DIAGNOSIS — H401131 Primary open-angle glaucoma, bilateral, mild stage: Secondary | ICD-10-CM | POA: Diagnosis not present

## 2019-09-13 ENCOUNTER — Telehealth: Payer: Self-pay | Admitting: Family Medicine

## 2019-09-13 MED ORDER — LISINOPRIL-HYDROCHLOROTHIAZIDE 20-12.5 MG PO TABS: 1.0000 | ORAL_TABLET | Freq: Every day | ORAL | 1 refills | Status: DC

## 2019-09-13 NOTE — Telephone Encounter (Signed)
Pt calling for a refill of his BP med - [place]: Speed  Please advise, thanks.

## 2019-09-13 NOTE — Telephone Encounter (Signed)
Refill sent as requested. 

## 2019-09-19 ENCOUNTER — Ambulatory Visit: Payer: PPO | Attending: Internal Medicine

## 2019-09-19 DIAGNOSIS — Z23 Encounter for immunization: Secondary | ICD-10-CM

## 2019-09-19 NOTE — Progress Notes (Signed)
   Covid-19 Vaccination Clinic  Name:  PERETZ RODAS    MRN: [id number] DOB: 03-Oct-1940  09/19/2019  Mr. Gavin was observed post Covid-19 immunization for 15 minutes without incident. He was provided with Vaccine Information Sheet and instruction to access the V-Safe system.   Mr. Alltop was instructed to call 911 with any severe reactions post vaccine: Marland Kitchen Difficulty breathing  . Swelling of face and throat  . A fast heartbeat  . A bad rash all over body  . Dizziness and weakness   Immunizations Administered    Name Date Dose VIS Date Route   Pfizer COVID-19 Vaccine 09/19/2019 12:33 PM 0.3 mL 05/19/2019 Intramuscular   Manufacturer: [place]   Lot: [id number]   [place]: [id number]

## 2019-10-24 ENCOUNTER — Other Ambulatory Visit: Payer: PPO

## 2019-10-24 ENCOUNTER — Telehealth: Payer: Self-pay | Admitting: Family Medicine

## 2019-10-24 DIAGNOSIS — E7800 Pure hypercholesterolemia, unspecified: Secondary | ICD-10-CM

## 2019-10-24 DIAGNOSIS — R7303 Prediabetes: Secondary | ICD-10-CM

## 2019-10-24 NOTE — Telephone Encounter (Signed)
-----   Message from Ellamae Sia sent at 10/12/2019  9:30 AM EDT ----- Regarding: Lab orders for Tuesday 5.18.21 Lab orders for a 6 month follow up appt

## 2020-02-26 DIAGNOSIS — H401131 Primary open-angle glaucoma, bilateral, mild stage: Secondary | ICD-10-CM | POA: Diagnosis not present

## 2020-03-06 DIAGNOSIS — H401131 Primary open-angle glaucoma, bilateral, mild stage: Secondary | ICD-10-CM | POA: Diagnosis not present

## 2020-04-11 ENCOUNTER — Telehealth: Payer: Self-pay | Admitting: Family Medicine

## 2020-04-11 NOTE — Telephone Encounter (Signed)
Called and left vm for the patient to call back to reschedule 04/30/20 appt  And cpe labs due to dr not being in the office. EM 04/11/20

## 2020-04-23 ENCOUNTER — Telehealth: Payer: Self-pay | Admitting: Family Medicine

## 2020-04-23 DIAGNOSIS — E7800 Pure hypercholesterolemia, unspecified: Secondary | ICD-10-CM

## 2020-04-23 DIAGNOSIS — R972 Elevated prostate specific antigen [PSA]: Secondary | ICD-10-CM

## 2020-04-23 DIAGNOSIS — R7303 Prediabetes: Secondary | ICD-10-CM

## 2020-04-23 NOTE — Telephone Encounter (Signed)
-----   Message from Ellamae Sia sent at 04/11/2020  4:28 PM EDT ----- Regarding: Lab orders Wednesday, 11.17.21 Patient is scheduled for CPX labs, please order future labs, Thanks , Karna Christmas

## 2020-04-24 ENCOUNTER — Other Ambulatory Visit (INDEPENDENT_AMBULATORY_CARE_PROVIDER_SITE_OTHER): Payer: PPO

## 2020-04-24 ENCOUNTER — Other Ambulatory Visit: Payer: Self-pay

## 2020-04-24 ENCOUNTER — Ambulatory Visit (INDEPENDENT_AMBULATORY_CARE_PROVIDER_SITE_OTHER): Payer: PPO

## 2020-04-24 VITALS — BP 136/87 | Wt 180.0 lb

## 2020-04-24 DIAGNOSIS — R7303 Prediabetes: Secondary | ICD-10-CM

## 2020-04-24 DIAGNOSIS — E7800 Pure hypercholesterolemia, unspecified: Secondary | ICD-10-CM

## 2020-04-24 DIAGNOSIS — Z0000 Encounter for general adult medical examination without abnormal findings: Secondary | ICD-10-CM

## 2020-04-24 LAB — COMPREHENSIVE METABOLIC PANEL
ALT: 16 U/L (ref 0–53)
AST: 19 U/L (ref 0–37)
Albumin: 4.3 g/dL (ref 3.5–5.2)
Alkaline Phosphatase: 58 U/L (ref 39–117)
BUN: 23 mg/dL (ref 6–23)
CO2: 31 mEq/L (ref 19–32)
Calcium: 9.6 mg/dL (ref 8.4–10.5)
Chloride: 103 mEq/L (ref 96–112)
Creatinine, Ser: 1.11 mg/dL (ref 0.40–1.50)
GFR: 63.91 mL/min (ref >60.00)
Glucose, Bld: 91 mg/dL (ref 70–99)
Potassium: 4.4 mEq/L (ref 3.5–5.1)
Sodium: 141 mEq/L (ref 135–145)
Total Bilirubin: 0.9 mg/dL (ref 0.2–1.2)
Total Protein: 7.7 g/dL (ref 6.0–8.3)

## 2020-04-24 LAB — LIPID PANEL
Cholesterol: 164 mg/dL (ref 0–200)
HDL: 44.8 mg/dL (ref >39.00)
LDL Cholesterol: 108 mg/dL — ABNORMAL HIGH (ref 0–99)
NonHDL: 119.31
Total CHOL/HDL Ratio: 4
Triglycerides: 58 mg/dL (ref 0.0–149.0)
VLDL: 11.6 mg/dL (ref 0.0–40.0)

## 2020-04-24 LAB — HEMOGLOBIN A1C: Hgb A1c MFr Bld: 5.8 % (ref 4.6–6.5)

## 2020-04-24 NOTE — Patient Instructions (Signed)
Bobby Simpson , Thank you for taking time to come for your Medicare Wellness Visit. I appreciate your ongoing commitment to your health goals. Please review the following plan we discussed and let me know if I can assist you in the future.   Screening recommendations/referrals: Colonoscopy: no longer required Recommended yearly ophthalmology/optometry visit for glaucoma screening and checkup Recommended yearly dental visit for hygiene and checkup  Vaccinations: Influenza vaccine: declined Pneumococcal vaccine: declined Tdap vaccine: declined Shingles vaccine: due, check with your insurance regarding coverage/cost if interested    Covid-19: Completed series  Advanced directives: Advance directive discussed with you today. Even though you declined this today please call our office should you change your mind and we can give you the proper paperwork for you to fill out.  Conditions/risks identified: hypertension, hypercholesterolemia   Next appointment: Follow up in one year for your annual wellness visit.   Preventive Care 77 Years and Older, Male Preventive care refers to lifestyle choices and visits with your health care provider that can promote health and wellness. What does preventive care include?  A yearly physical exam. This is also called an annual well check.  Dental exams once or twice a year.  Routine eye exams. Ask your health care provider how often you should have your eyes checked.  Personal lifestyle choices, including:  Daily care of your teeth and gums.  Regular physical activity.  Eating a healthy diet.  Avoiding tobacco and drug use.  Limiting alcohol use.  Practicing safe sex.  Taking low doses of aspirin every day.  Taking vitamin and mineral supplements as recommended by your health care provider. What happens during an annual well check? The services and screenings done by your health care provider during your annual well check will depend on your  age, overall health, lifestyle risk factors, and family history of disease. Counseling  Your health care provider may ask you questions about your:  Alcohol use.  Tobacco use.  Drug use.  Emotional well-being.  Home and relationship well-being.  Sexual activity.  Eating habits.  History of falls.  Memory and ability to understand (cognition).  Work and work Statistician. Screening  You may have the following tests or measurements:  Height, weight, and BMI.  Blood pressure.  Lipid and cholesterol levels. These may be checked every 5 years, or more frequently if you are over 14 years old.  Skin check.  Lung cancer screening. You may have this screening every year starting at age 45 if you have a 30-pack-year history of smoking and currently smoke or have quit within the past 15 years.  Fecal occult blood test (FOBT) of the stool. You may have this test every year starting at age 63.  Flexible sigmoidoscopy or colonoscopy. You may have a sigmoidoscopy every 5 years or a colonoscopy every 10 years starting at age 23.  Prostate cancer screening. Recommendations will vary depending on your family history and other risks.  Hepatitis C blood test.  Hepatitis B blood test.  Sexually transmitted disease (STD) testing.  Diabetes screening. This is done by checking your blood sugar (glucose) after you have not eaten for a while (fasting). You may have this done every 1-3 years.  Abdominal aortic aneurysm (AAA) screening. You may need this if you are a current or former smoker.  Osteoporosis. You may be screened starting at age 48 if you are at high risk. Talk with your health care provider about your test results, treatment options, and if necessary, the need  for more tests. Vaccines  Your health care provider may recommend certain vaccines, such as:  Influenza vaccine. This is recommended every year.  Tetanus, diphtheria, and acellular pertussis (Tdap, Td) vaccine. You  may need a Td booster every 10 years.  Zoster vaccine. You may need this after age 34.  Pneumococcal 13-valent conjugate (PCV13) vaccine. One dose is recommended after age 61.  Pneumococcal polysaccharide (PPSV23) vaccine. One dose is recommended after age 28. Talk to your health care provider about which screenings and vaccines you need and how often you need them. This information is not intended to replace advice given to you by your health care provider. Make sure you discuss any questions you have with your health care provider. Document Released: 06/21/2015 Document Revised: 02/12/2016 Document Reviewed: 03/26/2015 Elsevier Interactive Patient Education  2017 [place] Prevention in the Home Falls can cause injuries. They can happen to people of all ages. There are many things you can do to make your home safe and to help prevent falls. What can I do on the outside of my home?  Regularly fix the edges of walkways and driveways and fix any cracks.  Remove anything that might make you trip as you walk through a door, such as a raised step or threshold.  Trim any bushes or trees on the path to your home.  Use bright outdoor lighting.  Clear any walking paths of anything that might make someone trip, such as rocks or tools.  Regularly check to see if handrails are loose or broken. Make sure that both sides of any steps have handrails.  Any raised decks and porches should have guardrails on the edges.  Have any leaves, snow, or ice cleared regularly.  Use sand or salt on walking paths during winter.  Clean up any spills in your garage right away. This includes oil or grease spills. What can I do in the bathroom?  Use night lights.  Install grab bars by the toilet and in the tub and shower. Do not use towel bars as grab bars.  Use non-skid mats or decals in the tub or shower.  If you need to sit down in the shower, use a plastic, non-slip stool.  Keep the floor  dry. Clean up any water that spills on the floor as soon as it happens.  Remove soap buildup in the tub or shower regularly.  Attach bath mats securely with double-sided non-slip rug tape.  Do not have throw rugs and other things on the floor that can make you trip. What can I do in the bedroom?  Use night lights.  Make sure that you have a light by your bed that is easy to reach.  Do not use any sheets or blankets that are too big for your bed. They should not hang down onto the floor.  Have a firm chair that has side arms. You can use this for support while you get dressed.  Do not have throw rugs and other things on the floor that can make you trip. What can I do in the kitchen?  Clean up any spills right away.  Avoid walking on wet floors.  Keep items that you use a lot in easy-to-reach places.  If you need to reach something above you, use a strong step stool that has a grab bar.  Keep electrical cords out of the way.  Do not use floor polish or wax that makes floors slippery. If you must use wax, use  non-skid floor wax.  Do not have throw rugs and other things on the floor that can make you trip. What can I do with my stairs?  Do not leave any items on the stairs.  Make sure that there are handrails on both sides of the stairs and use them. Fix handrails that are broken or loose. Make sure that handrails are as long as the stairways.  Check any carpeting to make sure that it is firmly attached to the stairs. Fix any carpet that is loose or worn.  Avoid having throw rugs at the top or bottom of the stairs. If you do have throw rugs, attach them to the floor with carpet tape.  Make sure that you have a light switch at the top of the stairs and the bottom of the stairs. If you do not have them, ask someone to add them for you. What else can I do to help prevent falls?  Wear shoes that:  Do not have high heels.  Have rubber bottoms.  Are comfortable and fit you  well.  Are closed at the toe. Do not wear sandals.  If you use a stepladder:  Make sure that it is fully opened. Do not climb a closed stepladder.  Make sure that both sides of the stepladder are locked into place.  Ask someone to hold it for you, if possible.  Clearly mark and make sure that you can see:  Any grab bars or handrails.  First and last steps.  Where the edge of each step is.  Use tools that help you move around (mobility aids) if they are needed. These include:  Canes.  Walkers.  Scooters.  Crutches.  Turn on the lights when you go into a dark area. Replace any light bulbs as soon as they burn out.  Set up your furniture so you have a clear path. Avoid moving your furniture around.  If any of your floors are uneven, fix them.  If there are any pets around you, be aware of where they are.  Review your medicines with your doctor. Some medicines can make you feel dizzy. This can increase your chance of falling. Ask your doctor what other things that you can do to help prevent falls. This information is not intended to replace advice given to you by your health care provider. Make sure you discuss any questions you have with your health care provider. Document Released: 03/21/2009 Document Revised: 10/31/2015 Document Reviewed: 06/29/2014 Elsevier Interactive Patient Education  2017 [employer].

## 2020-04-24 NOTE — Progress Notes (Addendum)
Subjective:   Bobby Simpson is a 79 y.o. male who presents for Medicare Annual/Subsequent preventive examination.  Review of Systems: N/A      I connected with the patient today by telephone and verified that I am speaking with the correct person using two identifiers. Location patient: home Location nurse: work Persons participating in the telephone visit: patient, nurse.   I discussed the limitations, risks, security and privacy concerns of performing an evaluation and management service by telephone and the availability of in person appointments. I also discussed with the patient that there may be a patient responsible charge related to this service. The patient expressed understanding and verbally consented to this telephonic visit.        Cardiac Risk Factors include: advanced age (>29men, >68 women);hypertension;male gender;Other (see comment), Risk factor comments: hypercholesterolemia     Objective:    Today's Vitals   04/24/20 1026  BP: 136/87  Weight: 180 lb (81.6 kg)   Body mass index is 27.37 kg/m.  Advanced Directives 04/24/2020 04/20/2019 04/15/2018 04/12/2017 12/17/2015  Does Patient Have a Medical Advance Directive? No No No No No  Does patient want to make changes to medical advance directive? No - Patient declined - - - -  Would patient like information on creating a medical advance directive? - No - Patient declined No - Patient declined - No - patient declined information    Current Medications (verified) Outpatient Encounter Medications as of 04/24/2020  Medication Sig  . latanoprost (XALATAN) 0.005 % ophthalmic solution Place 1 drop into both eyes at bedtime.   Marland Kitchen lisinopril-hydrochlorothiazide (ZESTORETIC) 20-12.5 MG tablet Take 1 tablet by mouth daily.  . Multiple Vitamins-Minerals (ONE-A-DAY 50 PLUS PO) Take 1 tablet by mouth daily.  . timolol (TIMOPTIC) 0.5 % ophthalmic solution Place 1 drop into both eyes daily.    No facility-administered  encounter medications on file as of 04/24/2020.    Allergies (verified) Patient has no known allergies.   History: Past Medical History:  Diagnosis Date  . Glaucoma   . History of chicken pox   . Hypertension    Past Surgical History:  Procedure Laterality Date  . CATARACT EXTRACTION, BILATERAL     Family History  Problem Relation Age of Onset  . Cancer Father        unsure type  . Asthma Brother    Social History   Socioeconomic History  . Marital status: Married    Spouse name: Not on file  . Number of children: Not on file  . Years of education: Not on file  . Highest education level: Not on file  Occupational History  . Not on file  Tobacco Use  . Smoking status: Former Smoker    Packs/day: 0.25    Years: 5.00    Pack years: 1.25    Types: Cigarettes  . Smokeless tobacco: Former Systems developer    Quit date: 06/17/1960  Vaping Use  . Vaping Use: Never used  Substance and Sexual Activity  . Alcohol use: No    Alcohol/week: 0.0 standard drinks    Comment: .25  . Drug use: No  . Sexual activity: Yes  Other Topics Concern  . Not on file  Social History Narrative   Married, 3 grown kids    Born in [place], been here since 1964   Social Determinants of [employer]: Low Risk   . Difficulty of Paying Living Expenses: Not hard at all  Food Insecurity: No Food  Insecurity  . Worried About Charity fundraiser in the Last Year: Never true  . Ran Out of Food in the Last Year: Never true  Transportation Needs: No Transportation Needs  . Lack of Transportation (Medical): No  . Lack of Transportation (Non-Medical): No  Physical Activity: Sufficiently Active  . Days of Exercise per Week: 7 days  . Minutes of Exercise per Session: 30 min  Stress: No Stress Concern Present  . Feeling of Stress : Not at all  Social Connections:   . Frequency of Communication with Friends and Family: Not on file  . Frequency of Social Gatherings with Friends and  Family: Not on file  . Attends Religious Services: Not on file  . Active Member of Clubs or Organizations: Not on file  . Attends Archivist Meetings: Not on file  . Marital Status: Not on file    Tobacco Counseling Counseling given: Not Answered   Clinical Intake:  Pre-visit preparation completed: Yes  Pain : No/denies pain     Nutritional Risks: None Diabetes: No  How often do you need to have someone help you when you read instructions, pamphlets, or other written materials from your doctor or pharmacy?: 1 - Never What is the last grade level you completed in school?: completed school in [place]  Diabetic: No Nutrition Risk Assessment:  Has the patient had any N/V/D within the last 2 months?  No  Does the patient have any non-healing wounds?  No  Has the patient had any unintentional weight loss or weight gain?  No   Diabetes:  Is the patient diabetic?  No  If diabetic, was a CBG obtained today?  N/A Did the patient bring in their glucometer from home?  N/A How often do you monitor your CBG's? N/A.   Financial Strains and Diabetes Management:  Are you having any financial strains with the device, your supplies or your medication? N/A.  Does the patient want to be seen by Chronic Care Management for management of their diabetes?  N/A Would the patient like to be referred to a Nutritionist or for Diabetic Management?  N/A   Interpreter Needed?: No  Information entered by :: CJohnson, LPN   Activities of Daily Living In your present state of health, do you have any difficulty performing the following activities: 04/24/2020  Hearing? N  Vision? N  Difficulty concentrating or making decisions? N  Walking or climbing stairs? N  Dressing or bathing? N  Doing errands, shopping? N  Preparing Food and eating ? N  Using the Toilet? N  In the past six months, have you accidently leaked urine? N  Do you have problems with loss of bowel control? N    Managing your Medications? N  Managing your Finances? N  Housekeeping or managing your Housekeeping? N  Some recent data might be hidden    Patient Care Team: Jinny Sanders, MD as PCP - General (Family Medicine) Vin-Parikh, Deirdre Peer, MD as Referring Physician (Ophthalmology)  Indicate any recent Medical Services you may have received from other than [facility] providers in the past year (date may be approximate).     Assessment:   This is a routine wellness examination for Bobby Simpson.  Hearing/Vision screen  Hearing Screening   125Hz  250Hz  500Hz  1000Hz  2000Hz  3000Hz  4000Hz  6000Hz  8000Hz   Right ear:           Left ear:           Vision Screening Comments: Patient gets annual  eye exams  Dietary issues and exercise activities discussed: Current Exercise Habits: Home exercise routine, Type of exercise: walking, Time (Minutes): 30, Frequency (Times/Week): 7, Weekly Exercise (Minutes/Week): 210, Intensity: Moderate, Exercise limited by: None identified  Goals    . Increase physical activity     When weather permits, I will continue to walk at least 2 miles daily.     . Patient Stated     04/20/2019, I will maintain and continue medications as prescribed.     . Patient Stated     04/24/2020, I will continue to walk everyday for 2 miles.      Depression Screen PHQ 2/9 Scores 04/24/2020 04/20/2019 04/15/2018 04/12/2017 12/17/2015 12/20/2014  PHQ - 2 Score 0 0 0 0 0 0  PHQ- 9 Score 0 0 0 0 - -    Fall Risk Fall Risk  04/24/2020 04/20/2019 04/15/2018 04/12/2017 12/17/2015  Falls in the past year? 0 0 0 No No  Number falls in past yr: 0 0 - - -  Injury with Fall? 0 0 - - -  Risk for fall due to : Medication side effect Medication side effect - - -  Follow up Falls evaluation completed;Falls prevention discussed Falls evaluation completed;Falls prevention discussed - - -    Any stairs in or around the home? Yes  If so, are there any without handrails? No  Home free of loose throw rugs  in walkways, pet beds, electrical cords, etc? Yes  Adequate lighting in your home to reduce risk of falls? Yes   ASSISTIVE DEVICES UTILIZED TO PREVENT FALLS:  Life alert? No  Use of a cane, walker or w/c? No  Grab bars in the bathroom? No  Shower chair or bench in shower? No  Elevated toilet seat or a handicapped toilet? No   TIMED UP AND GO:  Was the test performed? N/A, telephone visit .    Cognitive Function: MMSE - Mini Mental State Exam 04/24/2020 04/20/2019 04/15/2018 04/12/2017 12/17/2015  Orientation to time 5 5 5 5 5   Orientation to Place 5 5 5 5 5   Registration 3 3 3 3 3   Attention/ Calculation 5 5 0 0 0  Recall 3 3 3 3 3   Language- name 2 objects - - 0 0 0  Language- repeat 1 1 1 1 1   Language- follow 3 step command - - 3 3 3   Language- read & follow direction - - 0 0 0  Write a sentence - - 0 0 0  Copy design - - 0 0 0  Total score - - 20 20 20   Mini Cog  Mini-Cog screen was completed. Maximum score is 22. A value of 0 denotes this part of the MMSE was not completed or the patient failed this part of the Mini-Cog screening.       Immunizations Immunization History  Administered Date(s) Administered  . PFIZER SARS-COV-2 Vaccination 08/26/2019, 09/19/2019    TDAP status: Due, Education has been provided regarding the importance of this vaccine. Advised may receive this vaccine at local pharmacy or Health Dept. Aware to provide a copy of the vaccination record if obtained from local pharmacy or Health Dept. Verbalized acceptance and understanding. Flu Vaccine status: Declined, Education has been provided regarding the importance of this vaccine but patient still declined. Advised may receive this vaccine at local pharmacy or Health Dept. Aware to provide a copy of the vaccination record if obtained from local pharmacy or Health Dept. Verbalized acceptance and understanding. Pneumococcal vaccine  status: Declined,  Education has been provided regarding the importance  of this vaccine but patient still declined. Advised may receive this vaccine at local pharmacy or Health Dept. Aware to provide a copy of the vaccination record if obtained from local pharmacy or Health Dept. Verbalized acceptance and understanding.  Covid-19 vaccine status: Completed vaccines  Qualifies for Shingles Vaccine? Yes   Zostavax completed No   Shingrix Completed?: No.    Education has been provided regarding the importance of this vaccine. Patient has been advised to call insurance company to determine out of pocket expense if they have not yet received this vaccine. Advised may also receive vaccine at local pharmacy or Health Dept. Verbalized acceptance and understanding. Patient declined   Screening Tests Health Maintenance  Topic Date Due  . Hepatitis C Screening  Never done  . INFLUENZA VACCINE  09/06/2023 (Originally 01/07/2020)  . TETANUS/TDAP  12/16/2025 (Originally [birth year]26/1961)  . PNA vac Low Risk Adult (1 of 2 - PCV13) 12/16/2025 (Originally 06/27/2007)  . COVID-19 Vaccine  Completed    Health Maintenance  Health Maintenance Due  Topic Date Due  . Hepatitis C Screening  Never done    Colorectal cancer screening: No longer required.   Lung Cancer Screening: (Low Dose CT Chest recommended if Age 76-80 years, 30 pack-year currently smoking OR have quit w/in 15years.) does not qualify.    Additional Screening:  Hepatitis C Screening: does qualify; Completed due  Vision Screening: Recommended annual ophthalmology exams for early detection of glaucoma and other disorders of the eye. Is the patient up to date with their annual eye exam?  Yes  Who is the provider or what is the name of the office in which the patient attends annual eye exams? Dr. Holley Bouche If pt is not established with a provider, would they like to be referred to a provider to establish care? No .   Dental Screening: Recommended annual dental exams for proper oral hygiene  Community Resource Referral  / Chronic Care Management: CRR required this visit?  No   CCM required this visit?  No      Plan:     I have personally reviewed and noted the following in the patient's chart:   . Medical and social history . Use of alcohol, tobacco or illicit drugs  . Current medications and supplements . Functional ability and status . Nutritional status . Physical activity . Advanced directives . List of other physicians . Hospitalizations, surgeries, and ER visits in previous 12 months . Vitals . Screenings to include cognitive, depression, and falls . Referrals and appointments  In addition, I have reviewed and discussed with patient certain preventive protocols, quality metrics, and best practice recommendations. A written personalized care plan for preventive services as well as general preventive health recommendations were provided to patient.   Due to this being a telephonic visit, the after visit summary with patients personalized plan was offered to patient via office or my-chart.  Patient preferred to pick up at office at next visit or via mychart.   Andrez Grime, LPN   [id number]

## 2020-04-24 NOTE — Progress Notes (Signed)
PCP notes:  Health Maintenance: Flu- declined Pneumonia- Declined   Abnormal Screenings: none   Patient concerns: none   Nurse concerns: none   Next PCP appt.: 05/14/2020 @ 11 am

## 2020-04-25 NOTE — Progress Notes (Signed)
No critical labs need to be addressed urgently. We will discuss labs in detail at upcoming office visit.   

## 2020-04-30 ENCOUNTER — Encounter: Payer: PPO | Admitting: Family Medicine

## 2020-05-14 ENCOUNTER — Encounter: Payer: Self-pay | Admitting: Family Medicine

## 2020-05-14 ENCOUNTER — Other Ambulatory Visit: Payer: Self-pay

## 2020-05-14 ENCOUNTER — Ambulatory Visit (INDEPENDENT_AMBULATORY_CARE_PROVIDER_SITE_OTHER): Payer: PPO | Admitting: Family Medicine

## 2020-05-14 VITALS — BP 120/72 | HR 64 | Temp 97.7°F | Ht 68.5 in | Wt 188.5 lb

## 2020-05-14 DIAGNOSIS — I10 Essential (primary) hypertension: Secondary | ICD-10-CM

## 2020-05-14 DIAGNOSIS — E7800 Pure hypercholesterolemia, unspecified: Secondary | ICD-10-CM | POA: Diagnosis not present

## 2020-05-14 DIAGNOSIS — Z0000 Encounter for general adult medical examination without abnormal findings: Secondary | ICD-10-CM | POA: Diagnosis not present

## 2020-05-14 DIAGNOSIS — D400 Neoplasm of uncertain behavior of prostate: Secondary | ICD-10-CM | POA: Diagnosis not present

## 2020-05-14 DIAGNOSIS — R7303 Prediabetes: Secondary | ICD-10-CM | POA: Diagnosis not present

## 2020-05-14 NOTE — Assessment & Plan Note (Signed)
Refuses further assessment against medical advice.

## 2020-05-14 NOTE — Patient Instructions (Signed)
Keep up with walking and regular activity.  Work on [employer].Marland Kitchen avoid high cholesterol and high sugars foods.

## 2020-05-14 NOTE — Progress Notes (Signed)
Chief Complaint  Patient presents with  . Annual Exam    Part 2    History of Present Illness: HPI  The patient presents forcomplete physical and review of chronic health problems. He/She also has the following acute concerns today:  The patient saw a LPN or RN for medicare wellness visit.  Prevention and wellness was reviewed in detail. Note reviewed and important notes copied below.  Prediabetes: Stable control. Lab Results  Component Value Date   HGBA1C 5.8 04/24/2020    Elevated Cholesterol: Statin indicated given high risk for CAD... pt refused despite CAD risk. Lab Results  Component Value Date   CHOL 164 04/24/2020   HDL 44.80 04/24/2020   LDLCALC 108 (H) 04/24/2020   TRIG 58.0 04/24/2020   CHOLHDL 4 04/24/2020  Using medications without problems: Muscle aches:  Diet compliance:healthy Exercise: walking each day with dog Other complaints: The 10-year ASCVD risk score Mikey Bussing DC Brooke Bonito., et al., 2013) is: 20.5%   Values used to calculate the score:     Age: 25 years     Sex: Male     Is Non-Hispanic African American: Yes     Diabetic: No     Tobacco smoker: No     Systolic Blood Pressure: 562 mmHg     Is BP treated: Yes     HDL Cholesterol: 44.8 mg/dL     Total Cholesterol: 164 mg/dL   Hypertension:  At goal on lisinopril HCTZ 20/12.5 mg daily BP Readings from Last 3 Encounters:  05/14/20 120/72  04/24/20 136/87  04/28/19 109/67  Using medication without problems or lightheadedness:  none Chest pain with exertion: none Edema:none Short of breath: none Average home BPs: Other issues:    This visit occurred during the SARS-CoV-2 public health emergency.  Safety protocols were in place, including screening questions prior to the visit, additional usage of staff PPE, and extensive cleaning of exam room while observing appropriate contact time as indicated for disinfecting solutions.   COVID 19 screen:  No recent travel or known exposure to COVID19 The  patient denies respiratory symptoms of COVID 19 at this time. The importance of social distancing was discussed today.     Review of Systems  Constitutional: Negative for chills and fever.  HENT: Negative for congestion and ear pain.   Eyes: Negative for pain and redness.  Respiratory: Negative for cough and shortness of breath.   Cardiovascular: Negative for chest pain, palpitations and leg swelling.  Gastrointestinal: Negative for abdominal pain, blood in stool, constipation, diarrhea, nausea and vomiting.  Genitourinary: Negative for dysuria.  Musculoskeletal: Negative for falls and myalgias.  Skin: Negative for rash.  Neurological: Negative for dizziness.  Psychiatric/Behavioral: Negative for depression. The patient is not nervous/anxious.       Past Medical History:  Diagnosis Date  . Glaucoma   . History of chicken pox   . Hypertension     reports that he has quit smoking. His smoking use included cigarettes. He has a 1.25 pack-year smoking history. He quit smokeless tobacco use about 59 years ago. He reports that he does not drink alcohol and does not use drugs.   Current Outpatient Medications:  .  latanoprost (XALATAN) 0.005 % ophthalmic solution, Place 1 drop into both eyes at bedtime. , Disp: , Rfl: 2 .  lisinopril-hydrochlorothiazide (ZESTORETIC) 20-12.5 MG tablet, Take 1 tablet by mouth daily., Disp: 90 tablet, Rfl: 1 .  Multiple Vitamins-Minerals (ONE-A-DAY 50 PLUS PO), Take 1 tablet by mouth daily., Disp: , Rfl:  .  timolol (TIMOPTIC) 0.5 % ophthalmic solution, Place 1 drop into both eyes daily. , Disp: , Rfl: 0   Observations/Objective: Blood pressure 120/72, pulse 64, temperature 97.7 F (36.5 C), temperature source Temporal, height 5' 8.5" (1.74 m), weight 188 lb 8 oz (85.5 kg), SpO2 99 %.  Physical Exam Constitutional:      General: He is not in acute distress.    Appearance: Normal appearance. He is well-developed. He is not ill-appearing or  toxic-appearing.  HENT:     Head: Normocephalic and atraumatic.     Right Ear: Hearing, tympanic membrane, ear canal and external ear normal.     Left Ear: Hearing, tympanic membrane, ear canal and external ear normal.     Nose: Nose normal.     Mouth/Throat:     Pharynx: Uvula midline.  Eyes:     General: Lids are normal. Lids are everted, no foreign bodies appreciated.     Conjunctiva/sclera: Conjunctivae normal.     Pupils: Pupils are equal, round, and reactive to light.  Neck:     Thyroid: No thyroid mass or thyromegaly.     Vascular: No carotid bruit.     Trachea: Trachea and phonation normal.  Cardiovascular:     Rate and Rhythm: Normal rate and regular rhythm.     Pulses: Normal pulses.     Heart sounds: S1 normal and S2 normal. No murmur heard.  No gallop.   Pulmonary:     Breath sounds: Normal breath sounds. No wheezing, rhonchi or rales.  Abdominal:     General: Bowel sounds are normal.     Palpations: Abdomen is soft.     Tenderness: There is no abdominal tenderness. There is no guarding or rebound.     Hernia: No hernia is present.  Musculoskeletal:     Cervical back: Normal range of motion and neck supple.  Lymphadenopathy:     Cervical: No cervical adenopathy.  Skin:    General: Skin is warm and dry.     Findings: No rash.  Neurological:     Mental Status: He is alert.     Cranial Nerves: No cranial nerve deficit.     Sensory: No sensory deficit.     Gait: Gait normal.     Deep Tendon Reflexes: Reflexes are normal and symmetric.  Psychiatric:        Speech: Speech normal.        Behavior: Behavior normal.        Judgment: Judgment normal.      Assessment and Plan   The patient's preventative maintenance and recommended screening tests for an annual wellness exam were reviewed in full today. Brought up to date unless services declined.  Counselled on the importance of diet, exercise, and its role in overall health and mortality. The patient's FH and  SH was reviewed, including their home life, tobacco status, and drug and alcohol status.   Reviewed in detail today 05/14/20 Pt again the same refusals as listed below despite recommendations and risk. Vaccines: He is not interested in any vaccines.  S/P COVID vaccine x 2  Colon: Cologuard Negative 2017, no further indicated Prostate: per uro [facility], He refused repeat biopsy. He has decided (Reviewed uro note) that he does not want any further PSAs.Pt agrees again 04/2020. No further eval and treatment wanted. nonsmoker Hep C: check next lab visit.   Hypercholesteremia Statin indicated given high risk for CAD... pt refused depsite CAD.  Prediabetes Stable control.. low carb diet.  Hypertension  At goal on lisinopril HCTZ 20/12.5 mg daily.  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   Eliezer Lofts, MD

## 2020-05-14 NOTE — Assessment & Plan Note (Signed)
Statin indicated given high risk for CAD... pt refused depsite CAD.

## 2020-05-14 NOTE — Assessment & Plan Note (Signed)
Stable control.. low carb diet.

## 2020-05-14 NOTE — Assessment & Plan Note (Signed)
At goal on lisinopril HCTZ 20/12.5 mg daily.

## 2020-06-26 ENCOUNTER — Ambulatory Visit (INDEPENDENT_AMBULATORY_CARE_PROVIDER_SITE_OTHER): Payer: PPO

## 2020-06-26 ENCOUNTER — Ambulatory Visit (HOSPITAL_COMMUNITY): Payer: PPO

## 2020-06-26 ENCOUNTER — Ambulatory Visit
Admission: EM | Admit: 2020-06-26 | Discharge: 2020-06-26 | Disposition: A | Discharge status: Home / Self Care | Payer: PPO | Attending: Family Medicine | Admitting: Family Medicine

## 2020-06-26 ENCOUNTER — Other Ambulatory Visit: Payer: Self-pay

## 2020-06-26 DIAGNOSIS — S93402A Sprain of unspecified ligament of left ankle, initial encounter: Secondary | ICD-10-CM

## 2020-06-26 DIAGNOSIS — M25572 Pain in left ankle and joints of left foot: Secondary | ICD-10-CM | POA: Diagnosis not present

## 2020-06-26 DIAGNOSIS — S99912A Unspecified injury of left ankle, initial encounter: Secondary | ICD-10-CM | POA: Diagnosis not present

## 2020-06-26 DIAGNOSIS — W010XXA Fall on same level from slipping, tripping and stumbling without subsequent striking against object, initial encounter: Secondary | ICD-10-CM

## 2020-06-26 NOTE — ED Triage Notes (Signed)
Triaged by provider  

## 2020-06-26 NOTE — ED Provider Notes (Signed)
[name]    CSN: [id number] Arrival date & time: 06/26/20  1114      History   Chief Complaint No chief complaint on file.   HPI Bobby Simpson is a 80 y.o. male.   Patient is a 80 year old male who presents today with left ankle pain, swelling.  This occurred yesterday after he had a slip and fall on the ice and twisted the ankle.  Has been able to ambulate since.  No numbness, tingling.     Past Medical History:  Diagnosis Date  . Glaucoma   . History of chicken pox   . Hypertension     Patient Active Problem List   Diagnosis Date Noted  . Hypercholesteremia 04/28/2019  . Elevated PSA, greater than or equal to 20 ng/ml 04/10/2016  . Prediabetes 04/10/2016  . Counseling regarding end of life decision making 12/20/2014  . Erectile dysfunction 08/16/2014  . Neoplasm of uncertain behavior of prostate 08/16/2014  . Hypertension 08/16/2014  . Glaucoma 08/16/2014    Past Surgical History:  Procedure Laterality Date  . CATARACT EXTRACTION, BILATERAL         Home Medications    Prior to Admission medications   Medication Sig Start Date End Date Taking? Authorizing Provider  latanoprost (XALATAN) 0.005 % ophthalmic solution Place 1 drop into both eyes at bedtime.  07/19/14   [provider]  lisinopril-hydrochlorothiazide (ZESTORETIC) 20-12.5 MG tablet Take 1 tablet by mouth daily. 09/13/19   Bedsole, Amy E, MD  Multiple Vitamins-Minerals (ONE-A-DAY 50 PLUS PO) Take 1 tablet by mouth daily.    [provider]  timolol (TIMOPTIC) 0.5 % ophthalmic solution Place 1 drop into both eyes daily.  08/07/14   [provider]    Family History Family History  Problem Relation Age of Onset  . Cancer Father        unsure type  . Asthma Brother     Social History Social History   Tobacco Use  . Smoking status: Former Smoker    Packs/day: 0.25    Years: 5.00    Pack years: 1.25    Types: Cigarettes  . Smokeless tobacco: Former  Systems developer    Quit date: 06/17/1960  Vaping Use  . Vaping Use: Never used  Substance Use Topics  . Alcohol use: No    Alcohol/week: 0.0 standard drinks    Comment: .25  . Drug use: No     Allergies   Patient has no known allergies.   Review of Systems Review of Systems   Physical Exam Triage Vital Signs ED Triage Vitals  Enc Vitals Group     BP      Pulse      Resp      Temp      Temp src      SpO2      Weight      Height      Head Circumference      Peak Flow      Pain Score      Pain Loc      Pain Edu?      Excl. in [place]?    No data found.  Updated Vital Signs BP (!) 149/80 (BP Location: Left Arm)   Pulse 79   Temp 99.1 F (37.3 C) (Oral)   Resp 18   SpO2 98%   Visual Acuity Right Eye Distance:   Left Eye Distance:   Bilateral Distance:    Right Eye Near:  Left Eye Near:    Bilateral Near:     Physical Exam Vitals and nursing note reviewed.  Constitutional:      Appearance: Normal appearance.  HENT:     Head: Normocephalic and atraumatic.  Eyes:     Conjunctiva/sclera: Conjunctivae normal.  Pulmonary:     Effort: Pulmonary effort is normal.  Musculoskeletal:        General: Normal range of motion.     Cervical back: Normal range of motion.       Feet:     Comments: Tenderness, swelling to left lateral malleolus area.  Able to do range of motion but mild limited.  2+ pedal pulse.  Normal temperature and color  Skin:    General: Skin is warm and dry.  Neurological:     Mental Status: He is alert.  Psychiatric:        Mood and Affect: Mood normal.      UC Treatments / Results  Labs (all labs ordered are listed, but only abnormal results are displayed) Labs Reviewed - No data to display  EKG   Radiology DG Ankle Complete Left  Result Date: 06/26/2020 CLINICAL DATA:  [place] and injured left ankle today. EXAM: LEFT ANKLE COMPLETE - 3+ VIEW COMPARISON:  None. FINDINGS: The ankle mortise is maintained. No acute ankle fracture is  identified. No definite ankle joint effusion. The mid and hindfoot bony structures are intact. Small calcaneal heel spurs are noted. IMPRESSION: No acute ankle fracture. Electronically Signed   By: Marijo Sanes M.D.   On: 06/26/2020 12:35    Procedures Procedures (including critical care time)  Medications Ordered in UC Medications - No data to display  Initial Impression / Assessment and Plan / UC Course  I have reviewed the triage vital signs and the nursing notes.  Pertinent labs & imaging results that were available during my care of the patient were reviewed by me and considered in my medical decision making (see chart for details).     Left ankle sprain X-ray without any acute fractures. Rest, ice, elevate.  Tylenol or ibuprofen for the pain and inflammation.  Ace wrap for compression. Follow up as needed for continued or worsening symptoms  Final Clinical Impressions(s) / UC Diagnoses   Final diagnoses:  Sprain of left ankle, unspecified ligament, initial encounter     Discharge Instructions     No fractures on the x ray. Most likely bad sprain Rest, Ice the ankle and elevate  You can take tylenol or ibuprofen for the pain, inflammation.  Wear the ace wrap for compression and swelling.  Follow up as needed for continued or worsening symptoms     ED Prescriptions    None     PDMP not reviewed this encounter.   Orvan July, NP 06/26/20 1454

## 2020-06-26 NOTE — Discharge Instructions (Addendum)
No fractures on the x ray. Most likely bad sprain Rest, Ice the ankle and elevate  You can take tylenol or ibuprofen for the pain, inflammation.  Wear the ace wrap for compression and swelling.  Follow up as needed for continued or worsening symptoms

## 2020-09-03 DIAGNOSIS — H401131 Primary open-angle glaucoma, bilateral, mild stage: Secondary | ICD-10-CM | POA: Diagnosis not present

## 2020-09-16 ENCOUNTER — Telehealth: Payer: Self-pay | Admitting: Family Medicine

## 2020-09-16 MED ORDER — LISINOPRIL-HYDROCHLOROTHIAZIDE 20-12.5 MG PO TABS: 1.0000 | ORAL_TABLET | Freq: Every day | ORAL | 1 refills | Status: DC

## 2020-09-16 NOTE — Telephone Encounter (Signed)
  LAST APPOINTMENT DATE: 05/14/2020   NEXT APPOINTMENT DATE:@Visit  date not found  MEDICATION: lisinopril  PHARMACY: [facility]- [address]  Let patient know to contact pharmacy at the end of the day to make sure medication is ready.  Please notify patient to allow 48-72 hours to process  Encourage patient to contact the pharmacy for refills or they can request refills through [place]:   LAST REFILL:  QTY:  REFILL DATE:    OTHER COMMENTS:    Okay for refill?  Please advise

## 2020-09-16 NOTE — Telephone Encounter (Signed)
Refills sent as requested

## 2021-03-26 DIAGNOSIS — H401131 Primary open-angle glaucoma, bilateral, mild stage: Secondary | ICD-10-CM | POA: Diagnosis not present

## 2021-04-18 DIAGNOSIS — H401132 Primary open-angle glaucoma, bilateral, moderate stage: Secondary | ICD-10-CM | POA: Diagnosis not present

## 2021-04-27 NOTE — Progress Notes (Signed)
Subjective:   Bobby Simpson is a 80 y.o. male who presents for Medicare Annual/Subsequent preventive examination.  I connected with Carolan Clines today by telephone and verified that I am speaking with the correct person using two identifiers. Location patient: home Location provider: work Persons participating in the virtual visit: patient, Marine scientist.    I discussed the limitations, risks, security and privacy concerns of performing an evaluation and management service by telephone and the availability of in person appointments. I also discussed with the patient that there may be a patient responsible charge related to this service. The patient expressed understanding and verbally consented to this telephonic visit.    Interactive audio and video telecommunications were attempted between this provider and patient, however failed, due to patient having technical difficulties OR patient did not have access to video capability.  We continued and completed visit with audio only.  Some vital signs may be absent or patient reported.   Time Spent with patient on telephone encounter: 20 minutes  Review of Systems     Cardiac Risk Factors include: advanced age (>40men, >59 women);hypertension;dyslipidemia     Objective:    Today's Vitals   04/29/21 1028  Weight: 188 lb (85.3 kg)  Height: 5\' 8"  (1.727 m)   Body mass index is 28.59 kg/m.  Advanced Directives 04/29/2021 04/24/2020 04/20/2019 04/15/2018 04/12/2017 12/17/2015  Does Patient Have a Medical Advance Directive? No No No No No No  Does patient want to make changes to medical advance directive? - No - Patient declined - - - -  Would patient like information on creating a medical advance directive? Yes (MAU/Ambulatory/Procedural Areas - Information given) - No - Patient declined No - Patient declined - No - patient declined information    Current Medications (verified) Outpatient Encounter Medications as of 04/29/2021  Medication  Sig   latanoprost (XALATAN) 0.005 % ophthalmic solution Place 1 drop into both eyes at bedtime.    lisinopril-hydrochlorothiazide (ZESTORETIC) 20-12.5 MG tablet Take 1 tablet by mouth daily.   Multiple Vitamins-Minerals (ONE-A-DAY 50 PLUS PO) Take 1 tablet by mouth daily.   timolol (TIMOPTIC) 0.5 % ophthalmic solution Place 1 drop into both eyes daily.    No facility-administered encounter medications on file as of 04/29/2021.    Allergies (verified) Patient has no known allergies.   History: Past Medical History:  Diagnosis Date   Glaucoma    History of chicken pox    Hypertension    Past Surgical History:  Procedure Laterality Date   CATARACT EXTRACTION, BILATERAL     Family History  Problem Relation Age of Onset   Cancer Father        unsure type   Asthma Brother    Social History   Socioeconomic History   Marital status: Married    Spouse name: Not on file   Number of children: Not on file   Years of education: Not on file   Highest education level: Not on file  Occupational History   Not on file  Tobacco Use   Smoking status: Former    Packs/day: 0.25    Years: 5.00    Pack years: 1.25    Types: Cigarettes   Smokeless tobacco: Former    Quit date: 06/17/1960  Vaping Use   Vaping Use: Never used  Substance and Sexual Activity   Alcohol use: No    Alcohol/week: 0.0 standard drinks    Comment: .25   Drug use: No   Sexual activity: Yes  Other Topics Concern   Not on file  Social History Narrative   Married, 3 grown kids    Born in [place], been here since 1964   Social Determinants of Radio broadcast assistant Strain: Low Risk    Difficulty of Paying Living Expenses: Not hard at all  Food Insecurity: No Food Insecurity   Worried About Charity fundraiser in the Last Year: Never true   Arboriculturist in the Last Year: Never true  Transportation Needs: No Transportation Needs   Lack of Transportation (Medical): No   Lack of Transportation  (Non-Medical): No  Physical Activity: Sufficiently Active   Days of Exercise per Week: 7 days   Minutes of Exercise per Session: 30 min  Stress: No Stress Concern Present   Feeling of Stress : Not at all  Social Connections: Moderately Integrated   Frequency of Communication with Friends and Family: Twice a week   Frequency of Social Gatherings with Friends and Family: Twice a week   Attends Religious Services: More than 4 times per year   Active Member of [employer] or Organizations: No   Attends Music therapist: Never   Marital Status: Married    Tobacco Counseling Counseling given: Not Answered   Clinical Intake:  Pre-visit preparation completed: Yes  Pain : No/denies pain     BMI - recorded: 28.24 Nutritional Status: BMI 25 -29 Overweight Nutritional Risks: None Diabetes: No  How often do you need to have someone help you when you read instructions, pamphlets, or other written materials from your doctor or pharmacy?: 1 - Never  Diabetic?No  Interpreter Needed?: No  Information entered by :: Orrin Brigham LPN   Activities of Daily Living In your present state of health, do you have any difficulty performing the following activities: 04/29/2021  Hearing? N  Vision? N  Difficulty concentrating or making decisions? N  Walking or climbing stairs? N  Dressing or bathing? N  Doing errands, shopping? N  Preparing Food and eating ? N  Using the Toilet? N  In the past six months, have you accidently leaked urine? N  Do you have problems with loss of bowel control? N  Managing your Medications? N  Managing your Finances? N  Housekeeping or managing your Housekeeping? N  Some recent data might be hidden    Patient Care Team: Jinny Sanders, MD as PCP - General (Family Medicine) Vin-Parikh, Deirdre Peer, MD as Referring Physician (Ophthalmology)  Indicate any recent Medical Services you may have received from other than [facility] providers in the past year  (date may be approximate).     Assessment:   This is a routine wellness examination for Bobby Simpson.  Hearing/Vision screen Hearing Screening - Comments:: No issues Vision Screening - Comments:: Last exam 04/2021, Dr. Jeni Salles, wears glasses  Dietary issues and exercise activities discussed: Current Exercise Habits: Home exercise routine, Type of exercise: walking, Time (Minutes): 30, Frequency (Times/Week): 7, Weekly Exercise (Minutes/Week): 210, Intensity: Moderate   Goals Addressed             This Visit's Progress    Patient Stated       Would like to continue to do daily walks       Depression Screen PHQ 2/9 Scores 04/29/2021 04/24/2020 04/20/2019 04/15/2018 04/12/2017 12/17/2015 12/20/2014  PHQ - 2 Score 0 0 0 0 0 0 0  PHQ- 9 Score - 0 0 0 0 - -    Fall Risk Fall Risk  04/29/2021 04/24/2020 04/20/2019 04/15/2018 04/12/2017  Falls in the past year? 0 0 0 0 No  Number falls in past yr: 0 0 0 - -  Injury with Fall? 0 0 0 - -  Risk for fall due to : No Fall Risks Medication side effect Medication side effect - -  Follow up Falls prevention discussed Falls evaluation completed;Falls prevention discussed Falls evaluation completed;Falls prevention discussed - -    FALL RISK PREVENTION PERTAINING TO THE HOME:  Any stairs in or around the home? Yes  If so, are there any without handrails? No  Home free of loose throw rugs in walkways, pet beds, electrical cords, etc? Yes  Adequate lighting in your home to reduce risk of falls? Yes   ASSISTIVE DEVICES UTILIZED TO PREVENT FALLS:  Life alert? No  Use of a cane, walker or w/c? No  Grab bars in the bathroom? No  Shower chair or bench in shower? No  Elevated toilet seat or a handicapped toilet? No   TIMED UP AND GO:  Was the test performed? No , visit completed over the phone.    Cognitive Function: MMSE - Mini Mental State Exam 04/24/2020 04/20/2019 04/15/2018 04/12/2017 12/17/2015  Orientation to time 5 5 5 5 5    Orientation to Place 5 5 5 5 5   Registration 3 3 3 3 3   Attention/ Calculation 5 5 0 0 0  Recall 3 3 3 3 3   Language- name 2 objects - - 0 0 0  Language- repeat 1 1 1 1 1   Language- follow 3 step command - - 3 3 3   Language- read & follow direction - - 0 0 0  Write a sentence - - 0 0 0  Copy design - - 0 0 0  Total score - - 20 20 20         Immunizations Immunization History  Administered Date(s) Administered   PFIZER(Purple Top)SARS-COV-2 Vaccination 08/26/2019, 09/19/2019    TDAP status: Due, Education has been provided regarding the importance of this vaccine. Advised may receive this vaccine at local pharmacy or Health Dept. Aware to provide a copy of the vaccination record if obtained from local pharmacy or Health Dept. Verbalized acceptance and understanding.  Flu Vaccine status: Declined, Education has been provided regarding the importance of this vaccine but patient still declined. Advised may receive this vaccine at local pharmacy or Health Dept. Aware to provide a copy of the vaccination record if obtained from local pharmacy or Health Dept. Verbalized acceptance and understanding.  Pneumococcal vaccine status: Declined,  Education has been provided regarding the importance of this vaccine but patient still declined. Advised may receive this vaccine at local pharmacy or Health Dept. Aware to provide a copy of the vaccination record if obtained from local pharmacy or Health Dept. Verbalized acceptance and understanding.   Covid-19 vaccine status: Declined, Education has been provided regarding the importance of this vaccine but patient still declined. Advised may receive this vaccine at local pharmacy or Health Dept.or vaccine clinic. Aware to provide a copy of the vaccination record if obtained from local pharmacy or Health Dept. Verbalized acceptance and understanding.  Qualifies for Shingles Vaccine? Yes   Zostavax completed No   Shingrix Completed?: No.    Education has  been provided regarding the importance of this vaccine. Patient has been advised to call insurance company to determine out of pocket expense if they have not yet received this vaccine. Advised may also receive vaccine at local pharmacy or Health  Dept. Verbalized acceptance and understanding.  Screening Tests Health Maintenance  Topic Date Due   Zoster Vaccines- Shingrix (1 of 2) Never done   Pneumonia Vaccine 50+ Years old (1 - PCV) Never done   COVID-19 Vaccine (3 - Booster for Pfizer series) 11/14/2019   Hepatitis C Screening  05/14/2021 (Originally 06/26/1958)   INFLUENZA VACCINE  09/06/2023 (Originally 01/06/2021)   TETANUS/TDAP  12/16/2025 (Originally 06/26/1959)   HPV VACCINES  Aged Out    Health Maintenance  Health Maintenance Due  Topic Date Due   Zoster Vaccines- Shingrix (1 of 2) Never done   Pneumonia Vaccine 16+ Years old (1 - PCV) Never done   COVID-19 Vaccine (3 - Booster for Pfizer series) 11/14/2019    Colorectal Cancer screening:  Lung Cancer Screening: (Low Dose CT Chest recommended if Age 25-80 years, 30 pack-year currently smoking OR have quit w/in 15years.) does not qualify.     Additional Screening:  Hepatitis C Screening: does not qualify  Vision Screening: Recommended annual ophthalmology exams for early detection of glaucoma and other disorders of the eye. Is the patient up to date with their annual eye exam?  Yes  Who is the provider or what is the name of the office in which the patient attends annual eye exams? Dr. Jeni Salles   Dental Screening: Recommended annual dental exams for proper oral hygiene  Community Resource Referral / Chronic Care Management:  CRR required this visit?  No   CCM required this visit?  No      Plan:     I have personally reviewed and noted the following in the patient's chart:   Medical and social history Use of alcohol, tobacco or illicit drugs  Current medications and supplements including opioid  prescriptions. Patient is not currently taking opioid prescriptions. Functional ability and status Nutritional status Physical activity Advanced directives List of other physicians Hospitalizations, surgeries, and ER visits in previous 12 months Vitals Screenings to include cognitive, depression, and falls Referrals and appointments  In addition, I have reviewed and discussed with patient certain preventive protocols, quality metrics, and best practice recommendations. A written personalized care plan for preventive services as well as general preventive health recommendations were provided to patient.   Due to this being a telephonic visit, the after visit summary with patients personalized plan was offered to patient via mail or my-chart.  Patient preferred to pick up at office at next visit.   Loma Messing, LPN   [id number]   Nurse Health Advisor  Nurse Notes: none

## 2021-04-29 ENCOUNTER — Ambulatory Visit (INDEPENDENT_AMBULATORY_CARE_PROVIDER_SITE_OTHER): Payer: PPO

## 2021-04-29 VITALS — Ht 68.0 in | Wt 188.0 lb

## 2021-04-29 DIAGNOSIS — Z0000 Encounter for general adult medical examination without abnormal findings: Secondary | ICD-10-CM

## 2021-04-29 NOTE — Patient Instructions (Signed)
Bobby Simpson , Thank you for taking time to complete your Medicare Wellness Visit. I appreciate your ongoing commitment to your health goals. Please review the following plan we discussed and let me know if I can assist you in the future.   Screening recommendations/referrals: Colonoscopy: no longer required Recommended yearly ophthalmology/optometry visit for glaucoma screening and checkup Recommended yearly dental visit for hygiene and checkup  Vaccinations: Influenza vaccine: Declined today, please call office or local pharmacy to schedule if you change your mind Pneumococcal vaccine: Declined today, please call office or local pharmacy to schedule if you change your mind Tdap vaccine: Declined today, please call your local pharmacy to schedule if you change your mind Shingles vaccine: Declined today, please call your local pharmacy to schedule if you change your mind   Covid-19:  Newest booster available at your local pharmacy if you change your mind.  Advanced directives: information available at your next appointment  Conditions/risks identified: see problem list  Next appointment: Follow up in one year for your annual wellness visit.   Preventive Care 78 Years and Older, Male Preventive care refers to lifestyle choices and visits with your health care provider that can promote health and wellness. What does preventive care include? A yearly physical exam. This is also called an annual well check. Dental exams once or twice a year. Routine eye exams. Ask your health care provider how often you should have your eyes checked. Personal lifestyle choices, including: Daily care of your teeth and gums. Regular physical activity. Eating a healthy diet. Avoiding tobacco and drug use. Limiting alcohol use. Practicing safe sex. Taking low doses of aspirin every day. Taking vitamin and mineral supplements as recommended by your health care provider. What happens during an annual well  check? The services and screenings done by your health care provider during your annual well check will depend on your age, overall health, lifestyle risk factors, and family history of disease. Counseling  Your health care provider may ask you questions about your: Alcohol use. Tobacco use. Drug use. Emotional well-being. Home and relationship well-being. Sexual activity. Eating habits. History of falls. Memory and ability to understand (cognition). Work and work Statistician. Screening  You may have the following tests or measurements: Height, weight, and BMI. Blood pressure. Lipid and cholesterol levels. These may be checked every 5 years, or more frequently if you are over 78 years old. Skin check. Lung cancer screening. You may have this screening every year starting at age 78 if you have a 30-pack-year history of smoking and currently smoke or have quit within the past 15 years. Fecal occult blood test (FOBT) of the stool. You may have this test every year starting at age 78. Flexible sigmoidoscopy or colonoscopy. You may have a sigmoidoscopy every 5 years or a colonoscopy every 10 years starting at age 78. Prostate cancer screening. Recommendations will vary depending on your family history and other risks. Hepatitis C blood test. Hepatitis B blood test. Sexually transmitted disease (STD) testing. Diabetes screening. This is done by checking your blood sugar (glucose) after you have not eaten for a while (fasting). You may have this done every 1-3 years. Abdominal aortic aneurysm (AAA) screening. You may need this if you are a current or former smoker. Osteoporosis. You may be screened starting at age 34 if you are at high risk. Talk with your health care provider about your test results, treatment options, and if necessary, the need for more tests. Vaccines  Your health care provider  may recommend certain vaccines, such as: Influenza vaccine. This is recommended every  year. Tetanus, diphtheria, and acellular pertussis (Tdap, Td) vaccine. You may need a Td booster every 10 years. Zoster vaccine. You may need this after age 78. Pneumococcal 13-valent conjugate (PCV13) vaccine. One dose is recommended after age 78. Pneumococcal polysaccharide (PPSV23) vaccine. One dose is recommended after age 78. Talk to your health care provider about which screenings and vaccines you need and how often you need them. This information is not intended to replace advice given to you by your health care provider. Make sure you discuss any questions you have with your health care provider. Document Released: 06/21/2015 Document Revised: 02/12/2016 Document Reviewed: 03/26/2015 Elsevier Interactive Patient Education  2017 [employer] Prevention in the Home Falls can cause injuries. They can happen to people of all ages. There are many things you can do to make your home safe and to help prevent falls. What can I do on the outside of my home? Regularly fix the edges of walkways and driveways and fix any cracks. Remove anything that might make you trip as you walk through a door, such as a raised step or threshold. Trim any bushes or trees on the path to your home. Use bright outdoor lighting. Clear any walking paths of anything that might make someone trip, such as rocks or tools. Regularly check to see if handrails are loose or broken. Make sure that both sides of any steps have handrails. Any raised decks and porches should have guardrails on the edges. Have any leaves, snow, or ice cleared regularly. Use sand or salt on walking paths during winter. Clean up any spills in your garage right away. This includes oil or grease spills. What can I do in the bathroom? Use night lights. Install grab bars by the toilet and in the tub and shower. Do not use towel bars as grab bars. Use non-skid mats or decals in the tub or shower. If you need to sit down in the shower, use a  plastic, non-slip stool. Keep the floor dry. Clean up any water that spills on the floor as soon as it happens. Remove soap buildup in the tub or shower regularly. Attach bath mats securely with double-sided non-slip rug tape. Do not have throw rugs and other things on the floor that can make you trip. What can I do in the bedroom? Use night lights. Make sure that you have a light by your bed that is easy to reach. Do not use any sheets or blankets that are too big for your bed. They should not hang down onto the floor. Have a firm chair that has side arms. You can use this for support while you get dressed. Do not have throw rugs and other things on the floor that can make you trip. What can I do in the kitchen? Clean up any spills right away. Avoid walking on wet floors. Keep items that you use a lot in easy-to-reach places. If you need to reach something above you, use a strong step stool that has a grab bar. Keep electrical cords out of the way. Do not use floor polish or wax that makes floors slippery. If you must use wax, use non-skid floor wax. Do not have throw rugs and other things on the floor that can make you trip. What can I do with my stairs? Do not leave any items on the stairs. Make sure that there are handrails on both sides  of the stairs and use them. Fix handrails that are broken or loose. Make sure that handrails are as long as the stairways. Check any carpeting to make sure that it is firmly attached to the stairs. Fix any carpet that is loose or worn. Avoid having throw rugs at the top or bottom of the stairs. If you do have throw rugs, attach them to the floor with carpet tape. Make sure that you have a light switch at the top of the stairs and the bottom of the stairs. If you do not have them, ask someone to add them for you. What else can I do to help prevent falls? Wear shoes that: Do not have high heels. Have rubber bottoms. Are comfortable and fit you  well. Are closed at the toe. Do not wear sandals. If you use a stepladder: Make sure that it is fully opened. Do not climb a closed stepladder. Make sure that both sides of the stepladder are locked into place. Ask someone to hold it for you, if possible. Clearly mark and make sure that you can see: Any grab bars or handrails. First and last steps. Where the edge of each step is. Use tools that help you move around (mobility aids) if they are needed. These include: Canes. Walkers. Scooters. Crutches. Turn on the lights when you go into a dark area. Replace any light bulbs as soon as they burn out. Set up your furniture so you have a clear path. Avoid moving your furniture around. If any of your floors are uneven, fix them. If there are any pets around you, be aware of where they are. Review your medicines with your doctor. Some medicines can make you feel dizzy. This can increase your chance of falling. Ask your doctor what other things that you can do to help prevent falls. This information is not intended to replace advice given to you by your health care provider. Make sure you discuss any questions you have with your health care provider. Document Released: 03/21/2009 Document Revised: 10/31/2015 Document Reviewed: 06/29/2014 Elsevier Interactive Patient Education  2017 [employer].

## 2021-05-05 ENCOUNTER — Telehealth: Payer: Self-pay | Admitting: Family Medicine

## 2021-05-05 DIAGNOSIS — E7800 Pure hypercholesterolemia, unspecified: Secondary | ICD-10-CM

## 2021-05-05 DIAGNOSIS — R7303 Prediabetes: Secondary | ICD-10-CM

## 2021-05-05 NOTE — Telephone Encounter (Signed)
-----   Message from Ellamae Sia sent at 04/22/2021  3:32 PM EST ----- Regarding: Lab orders for Tuesday, 11.29.22 Patient is scheduled for CPX labs, please order future labs, Thanks , Karna Christmas

## 2021-05-06 ENCOUNTER — Other Ambulatory Visit: Payer: PPO

## 2021-05-15 ENCOUNTER — Encounter: Payer: PPO | Admitting: Family Medicine

## 2021-05-23 ENCOUNTER — Other Ambulatory Visit: Payer: PPO

## 2021-05-23 ENCOUNTER — Other Ambulatory Visit (INDEPENDENT_AMBULATORY_CARE_PROVIDER_SITE_OTHER): Payer: PPO

## 2021-05-23 ENCOUNTER — Other Ambulatory Visit: Payer: Self-pay

## 2021-05-23 DIAGNOSIS — R7303 Prediabetes: Secondary | ICD-10-CM

## 2021-05-23 DIAGNOSIS — E7800 Pure hypercholesterolemia, unspecified: Secondary | ICD-10-CM | POA: Diagnosis not present

## 2021-05-23 LAB — COMPREHENSIVE METABOLIC PANEL
ALT: 15 U/L (ref 0–53)
AST: 15 U/L (ref 0–37)
Albumin: 4.1 g/dL (ref 3.5–5.2)
Alkaline Phosphatase: 56 U/L (ref 39–117)
BUN: 23 mg/dL (ref 6–23)
CO2: 31 mEq/L (ref 19–32)
Calcium: 9.4 mg/dL (ref 8.4–10.5)
Chloride: 105 mEq/L (ref 96–112)
Creatinine, Ser: 1.15 mg/dL (ref 0.40–1.50)
GFR: 60.79 mL/min (ref >60.00)
Glucose, Bld: 97 mg/dL (ref 70–99)
Potassium: 4.4 mEq/L (ref 3.5–5.1)
Sodium: 142 mEq/L (ref 135–145)
Total Bilirubin: 0.6 mg/dL (ref 0.2–1.2)
Total Protein: 7.5 g/dL (ref 6.0–8.3)

## 2021-05-23 LAB — LIPID PANEL
Cholesterol: 161 mg/dL (ref 0–200)
HDL: 46 mg/dL (ref >39.00)
LDL Cholesterol: 101 mg/dL — ABNORMAL HIGH (ref 0–99)
NonHDL: 114.84
Total CHOL/HDL Ratio: 3
Triglycerides: 69 mg/dL (ref 0.0–149.0)
VLDL: 13.8 mg/dL (ref 0.0–40.0)

## 2021-05-23 LAB — HEMOGLOBIN A1C: Hgb A1c MFr Bld: 6.1 % (ref 4.6–6.5)

## 2021-05-23 NOTE — Progress Notes (Signed)
No critical labs need to be addressed urgently. We will discuss labs in detail at upcoming office visit.   

## 2021-05-30 ENCOUNTER — Encounter: Payer: Self-pay | Admitting: Family Medicine

## 2021-05-30 ENCOUNTER — Other Ambulatory Visit: Payer: Self-pay

## 2021-05-30 ENCOUNTER — Ambulatory Visit (INDEPENDENT_AMBULATORY_CARE_PROVIDER_SITE_OTHER): Payer: PPO | Admitting: Family Medicine

## 2021-05-30 VITALS — BP 122/70 | HR 71 | Temp 98.6°F | Ht 68.0 in | Wt 181.4 lb

## 2021-05-30 DIAGNOSIS — E7800 Pure hypercholesterolemia, unspecified: Secondary | ICD-10-CM | POA: Diagnosis not present

## 2021-05-30 DIAGNOSIS — I10 Essential (primary) hypertension: Secondary | ICD-10-CM | POA: Diagnosis not present

## 2021-05-30 DIAGNOSIS — R7303 Prediabetes: Secondary | ICD-10-CM | POA: Diagnosis not present

## 2021-05-30 DIAGNOSIS — Z0000 Encounter for general adult medical examination without abnormal findings: Secondary | ICD-10-CM

## 2021-05-30 DIAGNOSIS — D400 Neoplasm of uncertain behavior of prostate: Secondary | ICD-10-CM | POA: Diagnosis not present

## 2021-05-30 DIAGNOSIS — R972 Elevated prostate specific antigen [PSA]: Secondary | ICD-10-CM

## 2021-05-30 NOTE — Assessment & Plan Note (Signed)
Statin indicated but pt continue to refused medication of any type to treat. Encouraged exercise, weight loss, healthy eating habits.

## 2021-05-30 NOTE — Assessment & Plan Note (Signed)
Stable, chronic.  Continue current medication.   lisinoprill HCTZ 20/12.5 mg 1 tablet daily

## 2021-05-30 NOTE — Progress Notes (Signed)
Patient ID: Bobby Simpson, male    DOB: 1940-08-17, 80 y.o.   MRN: [id number]  This visit was conducted in person.  BP 122/70    Pulse 71    Temp 98.6 F (37 C) (Temporal)    Ht 5\' 8"  (1.727 m)    Wt 181 lb 6 oz (82.3 kg)    SpO2 98%    BMI 27.58 kg/m    CC: Chief Complaint  Patient presents with   Annual Exam    Part 2    Subjective:   HPI: Bobby Simpson is a 80 y.o. male presenting on 05/30/2021 for Annual Exam (Part 2)   The patient presents for complete physical and review of chronic health problems. He/She also has the following acute concerns today: none  The patient saw a LPN or RN for medicare wellness visit.  Prevention and wellness was reviewed in detail. Note reviewed and important notes copied below.  Hypertension:   Good control on lisinopril HCTZ. BP Readings from Last 3 Encounters:  05/30/21 122/70  06/26/20 (!) 149/80  05/14/20 120/72  Using medication without problems or lightheadedness:  none Chest pain with exertion:none Edema:none Short of breath:none Average home BPs: Other issues:   Prediabetes  Lab Results  Component Value Date   HGBA1C 6.1 05/23/2021   Elevated Cholesterol:  Stain continues to be indicated given increased ris of CAD.Marland Kitchen pt continues to refuse despite need. He does not want to  try elchol or zetia as well Lab Results  Component Value Date   CHOL 161 05/23/2021   HDL 46.00 05/23/2021   LDLCALC 101 (H) 05/23/2021   TRIG 69.0 05/23/2021   CHOLHDL 3 05/23/2021   The 10-year ASCVD risk score (Arnett DK, et al., 2019) is: 21.4%   Values used to calculate the score:     Age: 87 years     Sex: Male     Is Non-Hispanic African American: Yes     Diabetic: No     Tobacco smoker: No     Systolic Blood Pressure: 465 mmHg     Is BP treated: Yes     HDL Cholesterol: 46 mg/dL     Total Cholesterol: 161 mg/dL  Using medications without problems: Muscle aches:  Diet compliance: moderate Exercise: walking daily  2 miles  daily Other complaints:       Relevant past medical, surgical, family and social history reviewed and updated as indicated. Interim medical history since our last visit reviewed. Allergies and medications reviewed and updated. Outpatient Medications Prior to Visit  Medication Sig Dispense Refill   latanoprost (XALATAN) 0.005 % ophthalmic solution Place 1 drop into both eyes at bedtime.   2   lisinopril-hydrochlorothiazide (ZESTORETIC) 20-12.5 MG tablet Take 1 tablet by mouth daily. 90 tablet 1   Multiple Vitamins-Minerals (ONE-A-DAY 50 PLUS PO) Take 1 tablet by mouth daily.     timolol (TIMOPTIC) 0.5 % ophthalmic solution Place 1 drop into both eyes daily.   0   No facility-administered medications prior to visit.     Per HPI unless specifically indicated in ROS section below Review of Systems  Constitutional:  Negative for fatigue and fever.  HENT:  Negative for ear pain.   Eyes:  Negative for pain.  Respiratory:  Negative for cough and shortness of breath.   Cardiovascular:  Negative for chest pain, palpitations and leg swelling.  Gastrointestinal:  Negative for abdominal pain.  Genitourinary:  Negative for dysuria.  Musculoskeletal:  Negative for  arthralgias.  Neurological:  Negative for syncope, light-headedness and headaches.  Psychiatric/Behavioral:  Negative for dysphoric mood.   Objective:  BP 122/70    Pulse 71    Temp 98.6 F (37 C) (Temporal)    Ht 5\' 8"  (1.727 m)    Wt 181 lb 6 oz (82.3 kg)    SpO2 98%    BMI 27.58 kg/m   Wt Readings from Last 3 Encounters:  05/30/21 181 lb 6 oz (82.3 kg)  04/29/21 188 lb (85.3 kg)  05/14/20 188 lb 8 oz (85.5 kg)      Physical Exam Constitutional:      General: He is not in acute distress.    Appearance: Normal appearance. He is well-developed. He is not ill-appearing or toxic-appearing.  HENT:     Head: Normocephalic and atraumatic.     Right Ear: Hearing, tympanic membrane, ear canal and external ear normal.     Left Ear:  Hearing, tympanic membrane, ear canal and external ear normal.     Nose: Nose normal.     Mouth/Throat:     Pharynx: Uvula midline.  Eyes:     General: Lids are normal. Lids are everted, no foreign bodies appreciated.     Conjunctiva/sclera: Conjunctivae normal.     Pupils: Pupils are equal, round, and reactive to light.  Neck:     Thyroid: No thyroid mass or thyromegaly.     Vascular: No carotid bruit.     Trachea: Trachea and phonation normal.  Cardiovascular:     Rate and Rhythm: Normal rate and regular rhythm.     Pulses: Normal pulses.     Heart sounds: S1 normal and S2 normal. No murmur heard.   No gallop.  Pulmonary:     Breath sounds: Normal breath sounds. No wheezing, rhonchi or rales.  Abdominal:     General: Bowel sounds are normal.     Palpations: Abdomen is soft.     Tenderness: There is no abdominal tenderness. There is no guarding or rebound.     Hernia: No hernia is present.  Musculoskeletal:     Cervical back: Normal range of motion and neck supple.  Lymphadenopathy:     Cervical: No cervical adenopathy.  Skin:    General: Skin is warm and dry.     Findings: No rash.  Neurological:     Mental Status: He is alert.     Cranial Nerves: No cranial nerve deficit.     Sensory: No sensory deficit.     Gait: Gait normal.     Deep Tendon Reflexes: Reflexes are normal and symmetric.  Psychiatric:        Speech: Speech normal.        Behavior: Behavior normal.        Judgment: Judgment normal.      Results for orders placed or performed in visit on 05/23/21  Comprehensive metabolic panel  Result Value Ref Range   Sodium 142 135 - 145 mEq/L   Potassium 4.4 3.5 - 5.1 mEq/L   Chloride 105 96 - 112 mEq/L   CO2 31 19 - 32 mEq/L   Glucose, Bld 97 70 - 99 mg/dL   BUN 23 6 - 23 mg/dL   Creatinine, Ser 1.15 0.40 - 1.50 mg/dL   Total Bilirubin 0.6 0.2 - 1.2 mg/dL   Alkaline Phosphatase 56 39 - 117 U/L   AST 15 0 - 37 U/L   ALT 15 0 - 53 U/L   Total Protein 7.5  6.0  - 8.3 g/dL   Albumin 4.1 3.5 - 5.2 g/dL   GFR 60.79 >60.00 mL/min   Calcium 9.4 8.4 - 10.5 mg/dL  Lipid panel  Result Value Ref Range   Cholesterol 161 0 - 200 mg/dL   Triglycerides 69.0 0.0 - 149.0 mg/dL   HDL 46.00 >39.00 mg/dL   VLDL 13.8 0.0 - 40.0 mg/dL   LDL Cholesterol 101 (H) 0 - 99 mg/dL   Total CHOL/HDL Ratio 3    NonHDL 114.84   Hemoglobin A1c  Result Value Ref Range   Hgb A1c MFr Bld 6.1 4.6 - 6.5 %    This visit occurred during the SARS-CoV-2 public health emergency.  Safety protocols were in place, including screening questions prior to the visit, additional usage of staff PPE, and extensive cleaning of exam room while observing appropriate contact time as indicated for disinfecting solutions.   COVID 19 screen:  No recent travel or known exposure to COVID19 The patient denies respiratory symptoms of COVID 19 at this time. The importance of social distancing was discussed today.   Assessment and Plan   The patient's preventative maintenance and recommended screening tests for an annual wellness exam were reviewed in full today. Brought up to date unless services declined.  Counselled on the importance of diet, exercise, and its role in overall health and mortality. The patient's FH and SH was reviewed, including their home life, tobacco status, and drug and alcohol status.   Reviewed in detail today 05/14/20 Pt again the same refusals as listed below despite recommendations and risk.  Vaccines: He is not interested in any vaccines.  S/P COVID vaccine x 3  Colon: Cologuard  Negative 2017, no further indicated Prostate: per uro [facility],  He refused repeat biopsy.  He has decided (Reviewed uro note) that he does not want any further PSAs. Pt agrees again 05/2021 that he does not want further eval and treatment for possible prostate cancer.  nonsmoker Hep C: check next lab visit.  Problem List Items Addressed This Visit     Elevated PSA, greater than or equal to 20  ng/ml   Hypercholesteremia    Statin indicated but pt continue to refused medication of any type to treat. Encouraged exercise, weight loss, healthy eating habits.       Hypertension    Stable, chronic.  Continue current medication.   lisinoprill HCTZ 20/12.5 mg 1 tablet daily      Neoplasm of uncertain behavior of prostate    Not interested in further evaluation or treatment.      Prediabetes   Other Visit Diagnoses     Routine general medical examination at a health care facility    -  Primary         Medicare Wellness Visit with nurse, followed by  40 minute CPE with Dr. Rickard Patience, MD

## 2021-05-30 NOTE — Assessment & Plan Note (Signed)
Not interested in further evaluation or treatment.

## 2021-06-04 DIAGNOSIS — H401131 Primary open-angle glaucoma, bilateral, mild stage: Secondary | ICD-10-CM | POA: Diagnosis not present

## 2021-08-09 ENCOUNTER — Encounter: Payer: Self-pay | Admitting: Emergency Medicine

## 2021-08-09 ENCOUNTER — Other Ambulatory Visit: Payer: Self-pay

## 2021-08-09 ENCOUNTER — Ambulatory Visit
Admission: EM | Admit: 2021-08-09 | Discharge: 2021-08-09 | Disposition: A | Discharge status: Home / Self Care | Payer: PPO | Attending: Family Medicine | Admitting: Family Medicine

## 2021-08-09 ENCOUNTER — Ambulatory Visit (INDEPENDENT_AMBULATORY_CARE_PROVIDER_SITE_OTHER): Payer: PPO

## 2021-08-09 DIAGNOSIS — M25562 Pain in left knee: Secondary | ICD-10-CM

## 2021-08-09 DIAGNOSIS — M25462 Effusion, left knee: Secondary | ICD-10-CM | POA: Diagnosis not present

## 2021-08-09 MED ORDER — DICLOFENAC SODIUM 1 % EX GEL: 4.0000 g | Freq: Four times a day (QID) | CUTANEOUS | 0 refills | Status: AC

## 2021-08-09 MED ORDER — PREDNISONE 20 MG PO TABS: 20.0000 mg | ORAL_TABLET | Freq: Every day | ORAL | 0 refills | Status: AC

## 2021-08-09 NOTE — ED Provider Notes (Signed)
5 ?[facility] ? ? ? ?CSN: [id number] ?Arrival date & time: 08/09/21  0093 ? ? ?  ? ?History   ?Chief Complaint ?Chief Complaint  ?Patient presents with  ? Knee Pain  ? ? ?HPI ?Bobby Simpson is a 81 y.o. male.  ? ?HPI ?Patient presents with history of 6 days of worsening left knee pain. He denies injury or fall.  His pain is most prominent with extension and flexion of the knee. The pain is at the ACL of the knee. No history of prior injuries. He has not taken any medication for his knee but wears a brace with provides a minimal relief. ?Past Medical History:  ?Diagnosis Date  ? Glaucoma   ? History of chicken pox   ? Hypertension   ? ? ?Patient Active Problem List  ? Diagnosis Date Noted  ? Hypercholesteremia 04/28/2019  ? Elevated PSA, greater than or equal to 20 ng/ml 04/10/2016  ? Prediabetes 04/10/2016  ? Counseling regarding end of life decision making 12/20/2014  ? Erectile dysfunction 08/16/2014  ? Neoplasm of uncertain behavior of prostate 08/16/2014  ? Hypertension 08/16/2014  ? Glaucoma 08/16/2014  ? ? ?Past Surgical History:  ?Procedure Laterality Date  ? CATARACT EXTRACTION, BILATERAL    ? ? ? ? ? ?Home Medications   ? ?Prior to Admission medications   ?Medication Sig Start Date End Date Taking? Authorizing Provider  ?latanoprost (XALATAN) 0.005 % ophthalmic solution Place 1 drop into both eyes at bedtime.  07/19/14   [provider]  ?lisinopril-hydrochlorothiazide (ZESTORETIC) 20-12.5 MG tablet Take 1 tablet by mouth daily. 09/16/20   Bedsole, Amy E, MD  ?Multiple Vitamins-Minerals (ONE-A-DAY 50 PLUS PO) Take 1 tablet by mouth daily.    [provider]  ?timolol (TIMOPTIC) 0.5 % ophthalmic solution Place 1 drop into both eyes daily.  08/07/14   [provider]  ? ? ?Family History ?Family History  ?Problem Relation Age of Onset  ? Cancer Father   ?     unsure type  ? Asthma Brother   ? ? ?Social History ?Social History  ? ?Tobacco Use  ? Smoking status: Former  ?   Packs/day: 0.25  ?  Years: 5.00  ?  Pack years: 1.25  ?  Types: Cigarettes  ? Smokeless tobacco: Former  ?  Quit date: 06/17/1960  ?Vaping Use  ? Vaping Use: Never used  ?Substance Use Topics  ? Alcohol use: No  ?  Alcohol/week: 0.0 standard drinks  ?  Comment: .25  ? Drug use: No  ? ? ? ?Allergies   ?Patient has no known allergies. ? ? ?Review of Systems ?Review of Systems ?Pertinent negatives listed in HPI  ?Physical Exam ?Triage Vital Signs ?ED Triage Vitals [08/09/21 1138]  ?Enc Vitals Group  ?   BP (!) 167/83  ?   Pulse Rate 70  ?   Resp 18  ?   Temp 98.4 ?F (36.9 ?C)  ?   Temp Source Oral  ?   SpO2 96 %  ?   Weight   ?   Height   ?   Head Circumference   ?   Peak Flow   ?   Pain Score   ?   Pain Loc   ?   Pain Edu?   ?   Excl. in [place]?   ? ?No data found. ? ?Updated Vital Signs ?BP (!) 167/83 (BP Location: Left Arm)   Pulse 70   Temp 98.4 ?F (  36.9 ?C) (Oral)   Resp 18   SpO2 96%  ? ?Visual Acuity ?Right Eye Distance:   ?Left Eye Distance:   ?Bilateral Distance:   ? ?Right Eye Near:   ?Left Eye Near:    ?Bilateral Near:    ? ?Physical Exam ?General appearance: Alert, well developed, well nourished, cooperative  ?Head: Normocephalic, without obvious abnormality, atraumatic ?Respiratory: Respirations even and unlabored, normal respiratory rate ?Heart: Rate and rhythm normal. No gallop or murmurs noted on exam  ?Extremities: Left knee swelling, ACL tenderness. No gross deformities ?Skin: Skin color, texture, turgor normal. No rashes seen  ?Psych: Appropriate mood and affect. ?Neurologic: GCS 15, normal gait, normal coordination ?UC Treatments / Results  ?Labs ?(all labs ordered are listed, but only abnormal results are displayed) ?Labs Reviewed - No data to display ? ?EKG ? ? ?Radiology ?DG Knee Complete 4 Views Left ? ?Result Date: 08/09/2021 ?CLINICAL DATA:  80 year old male with 5 days of left knee pain. Limited range of motion. No known injury. EXAM: LEFT KNEE - COMPLETE 4+ VIEW COMPARISON:  None. FINDINGS:  Possible small suprapatellar joint effusion. Normal joint spaces and alignment for age. Bone mineralization is within normal limits. Patella intact. No acute osseous abnormality identified. IMPRESSION: Possible small joint effusion but otherwise negative for age radiographic appearance of the left knee. Electronically Signed   By: Genevie Ann M.D.   On: 08/09/2021 12:00   ? ?Procedures ?Procedures (including critical care time) ? ?Medications Ordered in UC ?Medications - No data to display ? ?Initial Impression / Assessment and Plan / UC Course  ?I have reviewed the triage vital signs and the nursing notes. ? ?Pertinent labs & imaging results that were available during my care of the patient were reviewed by me and considered in my medical decision making (see chart for details). ? ?  ?Acute left knee pain and left knee effusion  ?Prednisone 20 mg daily x 5 days. ?Apply Voltaren gel up to 4 times daily for pain ?Ice twice daily for 20 minutes increments ?Final Clinical Impressions(s) / UC Diagnoses  ? ?Final diagnoses:  ?Acute pain of left knee  ?Knee effusion, left  ? ?Discharge Instructions   ?None ?  ? ?ED Prescriptions   ? ? Medication Sig Dispense Auth. Provider  ? diclofenac Sodium (VOLTAREN) 1 % GEL Apply 4 g topically 4 (four) times daily. 100 g Scot Jun, FNP  ? predniSONE (DELTASONE) 20 MG tablet Take 1 tablet (20 mg total) by mouth daily with breakfast for 5 days. 5 tablet Scot Jun, FNP  ? ?  ? ?PDMP not reviewed this encounter. ?  ?Scot Jun, FNP ?08/12/21 2226 ? ?

## 2021-08-09 NOTE — Discharge Instructions (Addendum)
Continue wearing knee brace. ?Apply ice at least twice daily for 5-7 days. ?Increase ROM and leg stretches as tolerated. ?Take prednisone 20 mg daily x 5 days ?Apply Voltaren gel to the knee to improve pain.  ?

## 2021-08-09 NOTE — ED Triage Notes (Signed)
Pt presents with left knee pain x 6 days. Pt denies any injury  ?

## 2021-09-04 ENCOUNTER — Telehealth: Payer: Self-pay | Admitting: Family Medicine

## 2021-09-04 MED ORDER — LISINOPRIL-HYDROCHLOROTHIAZIDE 20-12.5 MG PO TABS: 1.0000 | ORAL_TABLET | Freq: Every day | ORAL | 1 refills | Status: DC

## 2021-09-04 NOTE — Telephone Encounter (Signed)
?  Encourage patient to contact the pharmacy for refills or they can request refills through [facility] ? ?LAST APPOINTMENT DATE:  Please schedule appointment if longer than 1 year ? ?NEXT APPOINTMENT DATE: ? ?MEDICATION:lisinopril-hydrochlorothiazide (ZESTORETIC) 20-12.5 MG tablet ? ?Is the patient out of medication?  ? ?[place], [place] ? ?Let patient know to contact pharmacy at the end of the day to make sure medication is ready. ? ?Please notify patient to allow 48-72 hours to process ? ?CLINICAL FILLS OUT ALL BELOW:  ? ?LAST REFILL: ? ?QTY: ? ?REFILL DATE: ? ? ? ?OTHER COMMENTS:  ? ? ?Okay for refill? ? ?Please advise ? ? ?  ?

## 2021-09-04 NOTE — Telephone Encounter (Signed)
Refills sent as requested

## 2021-10-06 DIAGNOSIS — H401131 Primary open-angle glaucoma, bilateral, mild stage: Secondary | ICD-10-CM | POA: Diagnosis not present

## 2022-02-20 DIAGNOSIS — H903 Sensorineural hearing loss, bilateral: Secondary | ICD-10-CM | POA: Diagnosis not present

## 2022-03-02 ENCOUNTER — Ambulatory Visit (INDEPENDENT_AMBULATORY_CARE_PROVIDER_SITE_OTHER): Payer: PPO

## 2022-03-02 ENCOUNTER — Ambulatory Visit
Admission: EM | Admit: 2022-03-02 | Discharge: 2022-03-02 | Disposition: A | Discharge status: Home / Self Care | Payer: PPO

## 2022-03-02 DIAGNOSIS — M25571 Pain in right ankle and joints of right foot: Secondary | ICD-10-CM

## 2022-03-02 NOTE — ED Triage Notes (Signed)
Pt. States for the past few days he has been experiencing right ankle pain.

## 2022-03-02 NOTE — ED Provider Notes (Addendum)
Bobby Simpson    CSN: [id number] Arrival date & time: 03/02/22  1233      History   Chief Complaint Chief Complaint  Patient presents with  . Ankle Pain    HPI Bobby Simpson is a 81 y.o. male.    Ankle Pain   Presents to urgent care with complaint of acute right ankle pain starting yesterday.  Denies any injury or known precipitating event.  No history of injury.  No history of gout.  No history of Plantar fasciitis.  No swelling in the ankle.  Past Medical History:  Diagnosis Date  . Glaucoma   . History of chicken pox   . Hypertension     Patient Active Problem List   Diagnosis Date Noted  . Hypercholesteremia 04/28/2019  . Elevated PSA, greater than or equal to 20 ng/ml 04/10/2016  . Prediabetes 04/10/2016  . Counseling regarding end of life decision making 12/20/2014  . Erectile dysfunction 08/16/2014  . Neoplasm of uncertain behavior of prostate 08/16/2014  . Hypertension 08/16/2014  . Glaucoma 08/16/2014    Past Surgical History:  Procedure Laterality Date  . CATARACT EXTRACTION, BILATERAL         Home Medications    Prior to Admission medications   Medication Sig Start Date End Date Taking? Authorizing Provider  diclofenac Sodium (VOLTAREN) 1 % GEL Apply 4 g topically 4 (four) times daily. 08/09/21   Scot Jun, FNP  dorzolamide-timolol (COSOPT) 22.3-6.8 MG/ML ophthalmic solution 1 drop 2 (two) times daily. 02/20/22   [provider]  latanoprost (XALATAN) 0.005 % ophthalmic solution Place 1 drop into both eyes at bedtime.  07/19/14   [provider]  lisinopril-hydrochlorothiazide (ZESTORETIC) 20-12.5 MG tablet Take 1 tablet by mouth daily. 09/04/21   Bedsole, Amy E, MD  Multiple Vitamins-Minerals (ONE-A-DAY 50 PLUS PO) Take 1 tablet by mouth daily.    [provider]  timolol (TIMOPTIC) 0.5 % ophthalmic solution Place 1 drop into both eyes daily.  08/07/14   [provider]    Family  History Family History  Problem Relation Age of Onset  . Cancer Father        unsure type  . Asthma Brother     Social History Social History   Tobacco Use  . Smoking status: Former    Packs/day: 0.25    Years: 5.00    Total pack years: 1.25    Types: Cigarettes  . Smokeless tobacco: Former    Quit date: 06/17/1960  Vaping Use  . Vaping Use: Never used  Substance Use Topics  . Alcohol use: No    Alcohol/week: 0.0 standard drinks of alcohol    Comment: .25  . Drug use: No     Allergies   Patient has no known allergies.   Review of Systems Review of Systems   Physical Exam Triage Vital Signs ED Triage Vitals  Enc Vitals Group     BP 03/02/22 1249 132/73     Pulse Rate 03/02/22 1249 69     Resp 03/02/22 1249 18     Temp 03/02/22 1249 97.9 F (36.6 C)     Temp src --      SpO2 03/02/22 1249 98 %     Weight --      Height --      Head Circumference --      Peak Flow --      Pain Score 03/02/22 1250 4     Pain Loc --  Pain Edu? --      Excl. in [place]? --    No data found.  Updated Vital Signs BP 132/73   Pulse 69   Temp 97.9 F (36.6 C)   Resp 18   SpO2 98%   Visual Acuity Right Eye Distance:   Left Eye Distance:   Bilateral Distance:    Right Eye Near:   Left Eye Near:    Bilateral Near:     Physical Exam Vitals reviewed.  Constitutional:      Appearance: Normal appearance.  Musculoskeletal:     Right ankle: No swelling, deformity or ecchymosis. No tenderness. Normal range of motion. Anterior drawer test negative.     Right Achilles Tendon: No tenderness.  Skin:    General: Skin is warm and dry.  Neurological:     General: No focal deficit present.     Mental Status: He is alert and oriented to person, place, and time.  Psychiatric:        Mood and Affect: Mood normal.        Behavior: Behavior normal.     UC Treatments / Results  Labs (all labs ordered are listed, but only abnormal results are displayed) Labs Reviewed - No  data to display  EKG   Radiology No results found.  Procedures Procedures (including critical care time)  Medications Ordered in UC Medications - No data to display  Initial Impression / Assessment and Plan / UC Course  I have reviewed the triage vital signs and the nursing notes.  Pertinent labs & imaging results that were available during my care of the patient were reviewed by me and considered in my medical decision making (see chart for details).   Unclear etiology of his ankle pain.  Physical exam is unremarkable.  Unable to elicit symptoms.  imaging ordered.  No acute osseous abnormalities per provider read.  Awaiting radiology overread.  Asked him to follow-up with his primary care provider.   Final Clinical Impressions(s) / UC Diagnoses   Final diagnoses:  None   Discharge Instructions   None    ED Prescriptions   None    PDMP not reviewed this encounter.   Rose Phi, FNP 03/02/22 [place], [place] 03/02/22 1352

## 2022-03-02 NOTE — Discharge Instructions (Addendum)
Follow-up with your primary care provider if symptoms continue for additional evaluation and possible referral to orthopedics.

## 2022-03-27 ENCOUNTER — Ambulatory Visit (INDEPENDENT_AMBULATORY_CARE_PROVIDER_SITE_OTHER): Payer: PPO | Admitting: Family Medicine

## 2022-03-27 ENCOUNTER — Encounter: Payer: Self-pay | Admitting: Family Medicine

## 2022-03-27 VITALS — BP 118/70 | HR 53 | Temp 98.6°F | Resp 16 | Ht 68.0 in | Wt 183.1 lb

## 2022-03-27 DIAGNOSIS — M25571 Pain in right ankle and joints of right foot: Secondary | ICD-10-CM

## 2022-03-27 DIAGNOSIS — M722 Plantar fascial fibromatosis: Secondary | ICD-10-CM | POA: Diagnosis not present

## 2022-03-27 NOTE — Progress Notes (Signed)
Patient ID: Bobby Simpson, male    DOB: [birth year], 81 y.o.   MRN: [id number]  This visit was conducted in person.  BP 118/70   Pulse (!) 53   Temp 98.6 F (37 C)   Resp 16   Ht '5\' 8"'$  (1.727 m)   Wt 183 lb 2 oz (83.1 kg)   SpO2 96%   BMI 27.84 kg/m    CC: Chief Complaint  Patient presents with   Follow-up    Subjective:   HPI: Bobby Simpson is a 81 y.o. male presenting on 03/27/2022 for Follow-up  Reviewed urgent care note from March 02, 2022 for acute right ankle pain. Ankle x-ray showed no acute fractures     No known injury, no fall.  He noted pain after working under kitchen sink.  Pain is over right lateral malleolus.  Occ pain and tingling in posterior leg for few moments... not associated with ankle pain, notes after sitting a while.   No low back pain  No redness or swelling of ankle.    Also He feels pain in right lateral ankle  Notes pain in heel when stepping on first thing getting out of bed.   He has not treated with anything.   No past back, ankle or foot surgeries.   Relevant past medical, surgical, family and social history reviewed and updated as indicated. Interim medical history since our last visit reviewed. Allergies and medications reviewed and updated. Outpatient Medications Prior to Visit  Medication Sig Dispense Refill   diclofenac Sodium (VOLTAREN) 1 % GEL Apply 4 g topically 4 (four) times daily. 100 g 0   dorzolamide-timolol (COSOPT) 22.3-6.8 MG/ML ophthalmic solution 1 drop 2 (two) times daily.     latanoprost (XALATAN) 0.005 % ophthalmic solution Place 1 drop into both eyes at bedtime.   2   lisinopril-hydrochlorothiazide (ZESTORETIC) 20-12.5 MG tablet Take 1 tablet by mouth daily. 90 tablet 1   Multiple Vitamins-Minerals (ONE-A-DAY 50 PLUS PO) Take 1 tablet by mouth daily.     timolol (TIMOPTIC) 0.5 % ophthalmic solution Place 1 drop into both eyes daily.   0   No facility-administered medications prior to visit.      Per HPI unless specifically indicated in ROS section below Review of Systems  Constitutional:  Negative for fatigue and fever.  HENT:  Negative for ear pain.   Eyes:  Negative for pain.  Respiratory:  Negative for cough and shortness of breath.   Cardiovascular:  Negative for chest pain, palpitations and leg swelling.  Gastrointestinal:  Negative for abdominal pain.  Genitourinary:  Negative for dysuria.  Musculoskeletal:  Negative for arthralgias.  Neurological:  Negative for syncope, light-headedness and headaches.  Psychiatric/Behavioral:  Negative for dysphoric mood.    Objective:  BP 118/70   Pulse (!) 53   Temp 98.6 F (37 C)   Resp 16   Ht '5\' 8"'$  (1.727 m)   Wt 183 lb 2 oz (83.1 kg)   SpO2 96%   BMI 27.84 kg/m   Wt Readings from Last 3 Encounters:  03/27/22 183 lb 2 oz (83.1 kg)  05/30/21 181 lb 6 oz (82.3 kg)  04/29/21 188 lb (85.3 kg)      Physical Exam Constitutional:      Appearance: He is well-developed.  HENT:     Head: Normocephalic.     Right Ear: Hearing normal.     Left Ear: Hearing normal.     Nose: Nose normal.  Neck:  Thyroid: No thyroid mass or thyromegaly.     Vascular: No carotid bruit.     Trachea: Trachea normal.  Cardiovascular:     Rate and Rhythm: Normal rate and regular rhythm.     Pulses: Normal pulses.     Heart sounds: Heart sounds not distant. No murmur heard.    No friction rub. No gallop.     Comments: No peripheral edema Pulmonary:     Effort: Pulmonary effort is normal. No respiratory distress.     Breath sounds: Normal breath sounds.  Skin:    General: Skin is warm and dry.     Findings: No rash.  Psychiatric:        Speech: Speech normal.        Behavior: Behavior normal.        Thought Content: Thought content normal.       Results for orders placed or performed in visit on 05/23/21  Comprehensive metabolic panel  Result Value Ref Range   Sodium 142 135 - 145 mEq/L   Potassium 4.4 3.5 - 5.1 mEq/L    Chloride 105 96 - 112 mEq/L   CO2 31 19 - 32 mEq/L   Glucose, Bld 97 70 - 99 mg/dL   BUN 23 6 - 23 mg/dL   Creatinine, Ser 1.15 0.40 - 1.50 mg/dL   Total Bilirubin 0.6 0.2 - 1.2 mg/dL   Alkaline Phosphatase 56 39 - 117 U/L   AST 15 0 - 37 U/L   ALT 15 0 - 53 U/L   Total Protein 7.5 6.0 - 8.3 g/dL   Albumin 4.1 3.5 - 5.2 g/dL   GFR 60.79 >60.00 mL/min   Calcium 9.4 8.4 - 10.5 mg/dL  Lipid panel  Result Value Ref Range   Cholesterol 161 0 - 200 mg/dL   Triglycerides 69.0 0.0 - 149.0 mg/dL   HDL 46.00 >39.00 mg/dL   VLDL 13.8 0.0 - 40.0 mg/dL   LDL Cholesterol 101 (H) 0 - 99 mg/dL   Total CHOL/HDL Ratio 3    NonHDL 114.84   Hemoglobin A1c  Result Value Ref Range   Hgb A1c MFr Bld 6.1 4.6 - 6.5 %     COVID 19 screen:  No recent travel or known exposure to COVID19 The patient denies respiratory symptoms of COVID 19 at this time. The importance of social distancing was discussed today.   Assessment and Plan    Problem List Items Addressed This Visit   None Visit Diagnoses     Plantar fasciitis, right    -  Primary   Acute right ankle pain           Start ice massage and home PT on plantar fascia and  right ankle mild sprain.  Can use 2-3 dsys of aleve twice daily for pain and inflammation.   Eliezer Lofts, MD

## 2022-03-27 NOTE — Patient Instructions (Signed)
Start ice massage and home PT on plantar fascia and  right ankle mild sprain.  Can use 2-3 dsys of aleve twice daily for pain and inflammation.

## 2022-04-06 DIAGNOSIS — H401132 Primary open-angle glaucoma, bilateral, moderate stage: Secondary | ICD-10-CM | POA: Diagnosis not present

## 2022-04-14 DIAGNOSIS — H401132 Primary open-angle glaucoma, bilateral, moderate stage: Secondary | ICD-10-CM | POA: Diagnosis not present

## 2022-04-20 ENCOUNTER — Telehealth: Payer: Self-pay | Admitting: Family Medicine

## 2022-04-20 NOTE — Telephone Encounter (Signed)
ERROR

## 2022-05-04 ENCOUNTER — Ambulatory Visit (INDEPENDENT_AMBULATORY_CARE_PROVIDER_SITE_OTHER): Payer: PPO

## 2022-05-04 VITALS — Ht 68.0 in | Wt 183.0 lb

## 2022-05-04 DIAGNOSIS — Z0000 Encounter for general adult medical examination without abnormal findings: Secondary | ICD-10-CM

## 2022-05-04 NOTE — Progress Notes (Signed)
Virtual Visit via Telephone Note  I connected with  Bobby Simpson on 05/04/22 at  3:15 PM EST by telephone and verified that I am speaking with the correct person using two identifiers.  Location: Patient: home Provider: Roy Persons participating in the virtual visit: [place]   I discussed the limitations, risks, security and privacy concerns of performing an evaluation and management service by telephone and the availability of in person appointments. The patient expressed understanding and agreed to proceed.  Interactive audio and video telecommunications were attempted between this nurse and patient, however failed, due to patient having technical difficulties OR patient did not have access to video capability.  We continued and completed visit with audio only.  Some vital signs may be absent or patient reported.   Dionisio Dinesh, LPN  Subjective:   Bobby Simpson is a 81 y.o. male who presents for Medicare Annual/Subsequent preventive examination.  Review of Systems     Cardiac Risk Factors include: advanced age (>45mn, >>1women);hypertension;dyslipidemia;male gender     Objective:    There were no vitals filed for this visit. There is no height or weight on file to calculate BMI.     05/04/2022    3:17 PM 04/29/2021   10:31 AM 04/24/2020   10:32 AM 04/20/2019   10:42 AM 04/15/2018   10:53 AM 04/12/2017    8:52 AM 12/17/2015    9:02 AM  Advanced Directives  Does Patient Have a Medical Advance Directive? No No No No No No No  Does patient want to make changes to medical advance directive?   No - Patient declined      Would patient like information on creating a medical advance directive? No - Patient declined Yes (MAU/Ambulatory/Procedural Areas - Information given)  No - Patient declined No - Patient declined  No - patient declined information    Current Medications (verified) Outpatient Encounter Medications as of 05/04/2022   Medication Sig   dorzolamide-timolol (COSOPT) 22.3-6.8 MG/ML ophthalmic solution 1 drop 2 (two) times daily.   latanoprost (XALATAN) 0.005 % ophthalmic solution Place 1 drop into both eyes at bedtime.    lisinopril-hydrochlorothiazide (ZESTORETIC) 20-12.5 MG tablet Take 1 tablet by mouth daily.   Multiple Vitamins-Minerals (ONE-A-DAY 50 PLUS PO) Take 1 tablet by mouth daily.   timolol (TIMOPTIC) 0.5 % ophthalmic solution Place 1 drop into both eyes daily.    diclofenac Sodium (VOLTAREN) 1 % GEL Apply 4 g topically 4 (four) times daily. (Patient not taking: Reported on 05/04/2022)   No facility-administered encounter medications on file as of 05/04/2022.    Allergies (verified) Patient has no known allergies.   History: Past Medical History:  Diagnosis Date   Glaucoma    History of chicken pox    Hypertension    Past Surgical History:  Procedure Laterality Date   CATARACT EXTRACTION, BILATERAL     Family History  Problem Relation Age of Onset   Cancer Father        unsure type   Asthma Brother    Social History   Socioeconomic History   Marital status: Married    Spouse name: Not on file   Number of children: Not on file   Years of education: Not on file   Highest education level: Not on file  Occupational History   Not on file  Tobacco Use   Smoking status: Former    Packs/day: 0.25    Years: 5.00    Total pack  years: 1.25    Types: Cigarettes   Smokeless tobacco: Former    Quit date: 06/17/1960  Vaping Use   Vaping Use: Never used  Substance and Sexual Activity   Alcohol use: No    Alcohol/week: 0.0 standard drinks of alcohol    Comment: .25   Drug use: No   Sexual activity: Yes  Other Topics Concern   Not on file  Social History Narrative   Married, 3 grown kids    Born in [place], been here since 1964   Social Determinants of Health   Financial Resource Strain: Low Risk  (05/04/2022)   Overall Financial Resource Strain (CARDIA)    Difficulty of  Paying Living Expenses: Not very hard  Food Insecurity: No Food Insecurity (05/04/2022)   Hunger Vital Sign    Worried About Running Out of Food in the Last Year: Never true    Ran Out of Food in the Last Year: Never true  Transportation Needs: No Transportation Needs (05/04/2022)   PRAPARE - Hydrologist (Medical): No    Lack of Transportation (Non-Medical): No  Physical Activity: Sufficiently Active (05/04/2022)   Exercise Vital Sign    Days of Exercise per Week: 7 days    Minutes of Exercise per Session: 30 min  Stress: No Stress Concern Present (05/04/2022)   [facility]    Feeling of Stress : Not at all  Social Connections: Moderately Integrated (05/04/2022)   Social Connection and Isolation Panel [NHANES]    Frequency of Communication with Friends and Family: Twice a week    Frequency of Social Gatherings with Friends and Family: Once a week    Attends Religious Services: More than 4 times per year    Active Member of [employer] or Organizations: No    Attends Music therapist: Never    Marital Status: Married    Tobacco Counseling Counseling given: Not Answered   Clinical Intake:  Pre-visit preparation completed: Yes  Pain : No/denies pain     Nutritional Risks: None Diabetes: No  How often do you need to have someone help you when you read instructions, pamphlets, or other written materials from your doctor or pharmacy?: 1 - Never  Diabetic?no  Interpreter Needed?: No  Information entered by :: Kirke Shaggy, LPN   Activities of Daily Living    05/04/2022    3:18 PM  In your present state of health, do you have any difficulty performing the following activities:  Hearing? 1  Vision? 0  Difficulty concentrating or making decisions? 0  Walking or climbing stairs? 0  Dressing or bathing? 0  Doing errands, shopping? 0  Preparing Food and eating ? N   Using the Toilet? N  In the past six months, have you accidently leaked urine? N  Do you have problems with loss of bowel control? N  Managing your Medications? N  Managing your Finances? N  Housekeeping or managing your Housekeeping? N    Patient Care Team: Jinny Sanders, MD as PCP - General (Family Medicine) Karren Burly Deirdre Peer, MD as Referring Physician (Ophthalmology)  Indicate any recent Medical Services you may have received from other than [facility] providers in the past year (date may be approximate).     Assessment:   This is a routine wellness examination for Bobby Simpson.  Hearing/Vision screen Hearing Screening - Comments:: wears aids sometimes Vision Screening - Comments:: Wears glasses- [facility]  Dietary issues and  exercise activities discussed: Current Exercise Habits: Home exercise routine, Type of exercise: walking, Time (Minutes): 30, Frequency (Times/Week): 7, Weekly Exercise (Minutes/Week): 210, Intensity: Mild   Goals Addressed             This Visit's Progress    DIET - EAT MORE FRUITS AND VEGETABLES         Depression Screen    05/04/2022    3:15 PM 04/29/2021   10:33 AM 04/24/2020   10:33 AM 04/20/2019   10:43 AM 04/15/2018   10:52 AM 04/12/2017    8:51 AM 12/17/2015    9:26 AM  PHQ 2/9 Scores  PHQ - 2 Score 0 0 0 0 0 0 0  PHQ- 9 Score 0  0 0 0 0     Fall Risk    05/04/2022    3:18 PM 04/29/2021   10:33 AM 04/24/2020   10:33 AM 04/20/2019   10:43 AM 04/15/2018   10:52 AM  Fall Risk   Falls in the past year? 0 0 0 0 0  Number falls in past yr: 0 0 0 0   Injury with Fall? 0 0 0 0   Risk for fall due to : No Fall Risks No Fall Risks Medication side effect Medication side effect   Follow up Falls prevention discussed;Falls evaluation completed Falls prevention discussed Falls evaluation completed;Falls prevention discussed Falls evaluation completed;Falls prevention discussed     FALL RISK PREVENTION PERTAINING TO THE HOME:  Any  stairs in or around the home? Yes  If so, are there any without handrails? No  Home free of loose throw rugs in walkways, pet beds, electrical cords, etc? Yes  Adequate lighting in your home to reduce risk of falls? Yes   ASSISTIVE DEVICES UTILIZED TO PREVENT FALLS:  Life alert? No  Use of a cane, walker or w/c? No  Grab bars in the bathroom? No  Shower chair or bench in shower? No  Elevated toilet seat or a handicapped toilet? No    Cognitive Function:    04/24/2020   10:35 AM 04/20/2019   10:45 AM 04/15/2018   10:53 AM 04/12/2017    8:51 AM 12/17/2015    9:25 AM  MMSE - Mini Mental State Exam  Orientation to time '5 5 5 5 5  '$ Orientation to Place '5 5 5 5 5  '$ Registration '3 3 3 3 3  '$ Attention/ Calculation 5 5 0 0 0  Recall '3 3 3 3 3  '$ Language- name 2 objects   0 0 0  Language- repeat '1 1 1 1 1  '$ Language- follow 3 step command   '3 3 3  '$ Language- read & follow direction   0 0 0  Write a sentence   0 0 0  Copy design   0 0 0  Total score   '20 20 20        '$ 05/04/2022    3:19 PM  6CIT Screen  What Year? 0 points  What month? 0 points  What time? 0 points  Count back from 20 0 points  Months in reverse 0 points  Repeat phrase 0 points  Total Score 0 points    Immunizations Immunization History  Administered Date(s) Administered   PFIZER(Purple Top)SARS-COV-2 Vaccination 08/26/2019, 09/19/2019    TDAP status: Due, Education has been provided regarding the importance of this vaccine. Advised may receive this vaccine at local pharmacy or Health Dept. Aware to provide a copy of the vaccination record if obtained  from local pharmacy or Health Dept. Verbalized acceptance and understanding.  Flu Vaccine status: Declined, Education has been provided regarding the importance of this vaccine but patient still declined. Advised may receive this vaccine at local pharmacy or Health Dept. Aware to provide a copy of the vaccination record if obtained from local pharmacy or Health  Dept. Verbalized acceptance and understanding.  Pneumococcal vaccine status: Declined,  Education has been provided regarding the importance of this vaccine but patient still declined. Advised may receive this vaccine at local pharmacy or Health Dept. Aware to provide a copy of the vaccination record if obtained from local pharmacy or Health Dept. Verbalized acceptance and understanding.   Covid-19 vaccine status: Completed vaccines  Qualifies for Shingles Vaccine? Yes   Zostavax completed No   Shingrix Completed?: No.    Education has been provided regarding the importance of this vaccine. Patient has been advised to call insurance company to determine out of pocket expense if they have not yet received this vaccine. Advised may also receive vaccine at local pharmacy or Health Dept. Verbalized acceptance and understanding.  Screening Tests Health Maintenance  Topic Date Due   Hepatitis C Screening  Never done   Zoster Vaccines- Shingrix (1 of 2) Never done   COVID-19 Vaccine (3 - 2023-24 season) 02/06/2022   Pneumonia Vaccine 12+ Years old (1 - PCV) 05/30/2022 (Originally 06/27/2007)   INFLUENZA VACCINE  09/06/2023 (Originally 01/06/2022)   Medicare Annual Wellness (AWV)  05/05/2023   HPV VACCINES  Aged Out    Health Maintenance  Health Maintenance Due  Topic Date Due   Hepatitis C Screening  Never done   Zoster Vaccines- Shingrix (1 of 2) Never done   COVID-19 Vaccine (3 - 2023-24 season) 02/06/2022    Colorectal cancer screening: No longer required.   Lung Cancer Screening: (Low Dose CT Chest recommended if Age 14-80 years, 30 pack-year currently smoking OR have quit w/in 15years.) does not qualify.   Additional Screening:  Hepatitis C Screening: does qualify; Completed no  Vision Screening: Recommended annual ophthalmology exams for early detection of glaucoma and other disorders of the eye. Is the patient up to date with their annual eye exam?  Yes  Who is the provider or  what is the name of the office in which the patient attends annual eye exams? [facility] If pt is not established with a provider, would they like to be referred to a provider to establish care? No .   Dental Screening: Recommended annual dental exams for proper oral hygiene  Community Resource Referral / Chronic Care Management: CRR required this visit?  No   CCM required this visit?  No      Plan:     I have personally reviewed and noted the following in the patient's chart:   Medical and social history Use of alcohol, tobacco or illicit drugs  Current medications and supplements including opioid prescriptions. Patient is not currently taking opioid prescriptions. Functional ability and status Nutritional status Physical activity Advanced directives List of other physicians Hospitalizations, surgeries, and ER visits in previous 12 months Vitals Screenings to include cognitive, depression, and falls Referrals and appointments  In addition, I have reviewed and discussed with patient certain preventive protocols, quality metrics, and best practice recommendations. A written personalized care plan for preventive services as well as general preventive health recommendations were provided to patient.     Dionisio Mike, LPN   15/40/0867   Nurse Notes: none

## 2022-05-04 NOTE — Patient Instructions (Signed)
Bobby Simpson , Thank you for taking time to come for your Medicare Wellness Visit. I appreciate your ongoing commitment to your health goals. Please review the following plan we discussed and let me know if I can assist you in the future.   Screening recommendations/referrals: Colonoscopy: aged out Recommended yearly ophthalmology/optometry visit for glaucoma screening and checkup Recommended yearly dental visit for hygiene and checkup  Vaccinations: Influenza vaccine: n/d Pneumococcal vaccine: n/d Tdap vaccine: n/d Shingles vaccine: n/d   Covid-19: n/d  Advanced directives: no  Conditions/risks identified: none  Next appointment: Follow up in one year for your annual wellness visit. 05/07/23 @ 11am by phone  Preventive Care 65 Years and Older, Male Preventive care refers to lifestyle choices and visits with your health care provider that can promote health and wellness. What does preventive care include? A yearly physical exam. This is also called an annual well check. Dental exams once or twice a year. Routine eye exams. Ask your health care provider how often you should have your eyes checked. Personal lifestyle choices, including: Daily care of your teeth and gums. Regular physical activity. Eating a healthy diet. Avoiding tobacco and drug use. Limiting alcohol use. Practicing safe sex. Taking low doses of aspirin every day. Taking vitamin and mineral supplements as recommended by your health care provider. What happens during an annual well check? The services and screenings done by your health care provider during your annual well check will depend on your age, overall health, lifestyle risk factors, and family history of disease. Counseling  Your health care provider may ask you questions about your: Alcohol use. Tobacco use. Drug use. Emotional well-being. Home and relationship well-being. Sexual activity. Eating habits. History of falls. Memory and ability to  understand (cognition). Work and work Statistician. Screening  You may have the following tests or measurements: Height, weight, and BMI. Blood pressure. Lipid and cholesterol levels. These may be checked every 5 years, or more frequently if you are over 47 years old. Skin check. Lung cancer screening. You may have this screening every year starting at age 77 if you have a 30-pack-year history of smoking and currently smoke or have quit within the past 15 years. Fecal occult blood test (FOBT) of the stool. You may have this test every year starting at age 11. Flexible sigmoidoscopy or colonoscopy. You may have a sigmoidoscopy every 5 years or a colonoscopy every 10 years starting at age 25. Prostate cancer screening. Recommendations will vary depending on your family history and other risks. Hepatitis C blood test. Hepatitis B blood test. Sexually transmitted disease (STD) testing. Diabetes screening. This is done by checking your blood sugar (glucose) after you have not eaten for a while (fasting). You may have this done every 1-3 years. Abdominal aortic aneurysm (AAA) screening. You may need this if you are a current or former smoker. Osteoporosis. You may be screened starting at age 34 if you are at high risk. Talk with your health care provider about your test results, treatment options, and if necessary, the need for more tests. Vaccines  Your health care provider may recommend certain vaccines, such as: Influenza vaccine. This is recommended every year. Tetanus, diphtheria, and acellular pertussis (Tdap, Td) vaccine. You may need a Td booster every 10 years. Zoster vaccine. You may need this after age 25. Pneumococcal 13-valent conjugate (PCV13) vaccine. One dose is recommended after age 6. Pneumococcal polysaccharide (PPSV23) vaccine. One dose is recommended after age 29. Talk to your health care provider about  which screenings and vaccines you need and how often you need them. This  information is not intended to replace advice given to you by your health care provider. Make sure you discuss any questions you have with your health care provider. Document Released: 06/21/2015 Document Revised: 02/12/2016 Document Reviewed: 03/26/2015 Elsevier Interactive Patient Education  2017 [employer] Prevention in the Home Falls can cause injuries. They can happen to people of all ages. There are many things you can do to make your home safe and to help prevent falls. What can I do on the outside of my home? Regularly fix the edges of walkways and driveways and fix any cracks. Remove anything that might make you trip as you walk through a door, such as a raised step or threshold. Trim any bushes or trees on the path to your home. Use bright outdoor lighting. Clear any walking paths of anything that might make someone trip, such as rocks or tools. Regularly check to see if handrails are loose or broken. Make sure that both sides of any steps have handrails. Any raised decks and porches should have guardrails on the edges. Have any leaves, snow, or ice cleared regularly. Use sand or salt on walking paths during winter. Clean up any spills in your garage right away. This includes oil or grease spills. What can I do in the bathroom? Use night lights. Install grab bars by the toilet and in the tub and shower. Do not use towel bars as grab bars. Use non-skid mats or decals in the tub or shower. If you need to sit down in the shower, use a plastic, non-slip stool. Keep the floor dry. Clean up any water that spills on the floor as soon as it happens. Remove soap buildup in the tub or shower regularly. Attach bath mats securely with double-sided non-slip rug tape. Do not have throw rugs and other things on the floor that can make you trip. What can I do in the bedroom? Use night lights. Make sure that you have a light by your bed that is easy to reach. Do not use any sheets or  blankets that are too big for your bed. They should not hang down onto the floor. Have a firm chair that has side arms. You can use this for support while you get dressed. Do not have throw rugs and other things on the floor that can make you trip. What can I do in the kitchen? Clean up any spills right away. Avoid walking on wet floors. Keep items that you use a lot in easy-to-reach places. If you need to reach something above you, use a strong step stool that has a grab bar. Keep electrical cords out of the way. Do not use floor polish or wax that makes floors slippery. If you must use wax, use non-skid floor wax. Do not have throw rugs and other things on the floor that can make you trip. What can I do with my stairs? Do not leave any items on the stairs. Make sure that there are handrails on both sides of the stairs and use them. Fix handrails that are broken or loose. Make sure that handrails are as long as the stairways. Check any carpeting to make sure that it is firmly attached to the stairs. Fix any carpet that is loose or worn. Avoid having throw rugs at the top or bottom of the stairs. If you do have throw rugs, attach them to the floor with  carpet tape. Make sure that you have a light switch at the top of the stairs and the bottom of the stairs. If you do not have them, ask someone to add them for you. What else can I do to help prevent falls? Wear shoes that: Do not have high heels. Have rubber bottoms. Are comfortable and fit you well. Are closed at the toe. Do not wear sandals. If you use a stepladder: Make sure that it is fully opened. Do not climb a closed stepladder. Make sure that both sides of the stepladder are locked into place. Ask someone to hold it for you, if possible. Clearly mark and make sure that you can see: Any grab bars or handrails. First and last steps. Where the edge of each step is. Use tools that help you move around (mobility aids) if they are  needed. These include: Canes. Walkers. Scooters. Crutches. Turn on the lights when you go into a dark area. Replace any light bulbs as soon as they burn out. Set up your furniture so you have a clear path. Avoid moving your furniture around. If any of your floors are uneven, fix them. If there are any pets around you, be aware of where they are. Review your medicines with your doctor. Some medicines can make you feel dizzy. This can increase your chance of falling. Ask your doctor what other things that you can do to help prevent falls. This information is not intended to replace advice given to you by your health care provider. Make sure you discuss any questions you have with your health care provider. Document Released: 03/21/2009 Document Revised: 10/31/2015 Document Reviewed: 06/29/2014 Elsevier Interactive Patient Education  2017 [employer].

## 2022-09-07 ENCOUNTER — Other Ambulatory Visit: Payer: Self-pay | Admitting: Family Medicine

## 2022-09-07 NOTE — Telephone Encounter (Signed)
Please call and schedule CPE with fasting labs prior with Dr. Diona Browner.  Already had [place].  Please send back to me to refill once scheduled.

## 2022-09-08 NOTE — Telephone Encounter (Signed)
Patient has been scheduled

## 2022-09-09 ENCOUNTER — Telehealth: Payer: Self-pay | Admitting: *Deleted

## 2022-09-09 DIAGNOSIS — R7303 Prediabetes: Secondary | ICD-10-CM

## 2022-09-09 DIAGNOSIS — E7800 Pure hypercholesterolemia, unspecified: Secondary | ICD-10-CM

## 2022-09-09 NOTE — Telephone Encounter (Signed)
-----   Message from Ellamae Sia sent at 09/09/2022 10:48 AM EDT ----- Regarding: Lab orders for Thursday, 4.18.24 Patient is scheduled for CPX labs, please order future labs, Thanks , Karna Christmas

## 2022-09-24 ENCOUNTER — Other Ambulatory Visit (INDEPENDENT_AMBULATORY_CARE_PROVIDER_SITE_OTHER): Payer: PPO

## 2022-09-24 DIAGNOSIS — E7800 Pure hypercholesterolemia, unspecified: Secondary | ICD-10-CM

## 2022-09-24 DIAGNOSIS — R7303 Prediabetes: Secondary | ICD-10-CM | POA: Diagnosis not present

## 2022-09-24 LAB — COMPREHENSIVE METABOLIC PANEL
ALT: 15 U/L (ref 0–53)
AST: 18 U/L (ref 0–37)
Albumin: 4.2 g/dL (ref 3.5–5.2)
Alkaline Phosphatase: 60 U/L (ref 39–117)
BUN: 20 mg/dL (ref 6–23)
CO2: 30 mEq/L (ref 19–32)
Calcium: 9.4 mg/dL (ref 8.4–10.5)
Chloride: 104 mEq/L (ref 96–112)
Creatinine, Ser: 1.06 mg/dL (ref 0.40–1.50)
GFR: 66.41 mL/min (ref >60.00)
Glucose, Bld: 90 mg/dL (ref 70–99)
Potassium: 4.2 mEq/L (ref 3.5–5.1)
Sodium: 141 mEq/L (ref 135–145)
Total Bilirubin: 0.6 mg/dL (ref 0.2–1.2)
Total Protein: 7.6 g/dL (ref 6.0–8.3)

## 2022-09-24 LAB — HEMOGLOBIN A1C: Hgb A1c MFr Bld: 6 % (ref 4.6–6.5)

## 2022-09-24 LAB — LIPID PANEL
Cholesterol: 162 mg/dL (ref 0–200)
HDL: 46.8 mg/dL (ref >39.00)
LDL Cholesterol: 99 mg/dL (ref 0–99)
NonHDL: 115.52
Total CHOL/HDL Ratio: 3
Triglycerides: 83 mg/dL (ref 0.0–149.0)
VLDL: 16.6 mg/dL (ref 0.0–40.0)

## 2022-09-24 NOTE — Progress Notes (Signed)
No critical labs need to be addressed urgently. We will discuss labs in detail at upcoming office visit.   

## 2022-09-30 ENCOUNTER — Encounter: Payer: Self-pay | Admitting: Family Medicine

## 2022-09-30 ENCOUNTER — Ambulatory Visit (INDEPENDENT_AMBULATORY_CARE_PROVIDER_SITE_OTHER): Payer: PPO | Admitting: Family Medicine

## 2022-09-30 VITALS — BP 110/62 | HR 65 | Temp 98.0°F | Ht 68.0 in | Wt 182.0 lb

## 2022-09-30 DIAGNOSIS — R972 Elevated prostate specific antigen [PSA]: Secondary | ICD-10-CM

## 2022-09-30 DIAGNOSIS — I10 Essential (primary) hypertension: Secondary | ICD-10-CM

## 2022-09-30 DIAGNOSIS — R7303 Prediabetes: Secondary | ICD-10-CM | POA: Diagnosis not present

## 2022-09-30 DIAGNOSIS — Z0000 Encounter for general adult medical examination without abnormal findings: Secondary | ICD-10-CM

## 2022-09-30 DIAGNOSIS — E7800 Pure hypercholesterolemia, unspecified: Secondary | ICD-10-CM | POA: Diagnosis not present

## 2022-09-30 NOTE — Assessment & Plan Note (Signed)
Pt again refuses further assessment or PSA check. 

## 2022-09-30 NOTE — Progress Notes (Signed)
Patient ID: Bobby Simpson, male    DOB: Apr 02, 1941, 82 y.o.   MRN: [id number]  This visit was conducted in person.  BP 110/62 (BP Location: Left Arm, Patient Position: Sitting, Cuff Size: Normal)   Pulse 65   Temp 98 F (36.7 C) (Temporal)   Ht  (1.727 m)   Wt 182 lb (82.6 kg)   SpO2 100%   BMI 27.67 kg/m    CC: Chief Complaint  Patient presents with   Medicare Wellness    Part 2    Subjective:   HPI: Bobby Simpson is a 82 y.o. male presenting on 09/30/2022 for Medicare Wellness (Part 2)   The patient presents for complete physical and review of chronic health problems. He/She also has the following acute concerns today: none  The patient saw a LPN or RN for medicare wellness visit. 05/04/22  Prevention and wellness was reviewed in detail. Note reviewed and important notes copied below.  Hypertension:   Good control on lisinopril HCTZ. BP Readings from Last 3 Encounters:  09/30/22 110/62  03/27/22 118/70  03/02/22 132/73  Using medication without problems or lightheadedness:  none Chest pain with exertion:none Edema:none Short of breath:none Average home BPs: Other issues:   Prediabetes  Lab Results  Component Value Date   HGBA1C 6.0 09/24/2022   Wt Readings from Last 3 Encounters:  09/30/22 182 lb (82.6 kg)  05/04/22 183 lb (83 kg)  03/27/22 183 lb 2 oz (83.1 kg)    Elevated Cholesterol:  Improved LDL<100, HDl above 40. Lab Results  Component Value Date   CHOL 162 09/24/2022   HDL 46.80 09/24/2022   LDLCALC 99 09/24/2022   TRIG 83.0 09/24/2022   CHOLHDL 3 09/24/2022   The ASCVD Risk score (Arnett DK, et al., 2019) failed to calculate for the following reasons:   The 2019 ASCVD risk score is only valid for ages 43 to 44 Using medications without problems: Muscle aches:  Diet compliance: moderate Exercise: walking daily  2 miles daily, garden, work in yard. Other complaints:  Flowsheet Row Office Visit from 09/30/2022 in [facility] Total Score 0          Relevant past medical, surgical, family and social history reviewed and updated as indicated. Interim medical history since our last visit reviewed. Allergies and medications reviewed and updated. Outpatient Medications Prior to Visit  Medication Sig Dispense Refill   diclofenac Sodium (VOLTAREN) 1 % GEL Apply 4 g topically 4 (four) times daily. 100 g 0   dorzolamide-timolol (COSOPT) 22.3-6.8 MG/ML ophthalmic solution 1 drop 2 (two) times daily.     latanoprost (XALATAN) 0.005 % ophthalmic solution Place 1 drop into both eyes at bedtime.   2   lisinopril-hydrochlorothiazide (ZESTORETIC) 20-12.5 MG tablet Take 1 tablet by mouth once daily 90 tablet 0   Multiple Vitamins-Minerals (ONE-A-DAY 50 PLUS PO) Take 1 tablet by mouth daily.     timolol (TIMOPTIC) 0.5 % ophthalmic solution Place 1 drop into both eyes daily.   0   No facility-administered medications prior to visit.     Per HPI unless specifically indicated in ROS section below Review of Systems  Constitutional:  Negative for fatigue and fever.  HENT:  Negative for ear pain.   Eyes:  Negative for pain.  Respiratory:  Negative for cough and shortness of breath.   Cardiovascular:  Negative for chest pain, palpitations and leg swelling.  Gastrointestinal:  Negative for abdominal  pain.  Genitourinary:  Negative for dysuria.  Musculoskeletal:  Negative for arthralgias.  Neurological:  Negative for syncope, light-headedness and headaches.  Psychiatric/Behavioral:  Negative for dysphoric mood.    Objective:  BP 110/62 (BP Location: Left Arm, Patient Position: Sitting, Cuff Size: Normal)   Pulse 65   Temp 98 F (36.7 C) (Temporal)   Ht  (1.727 m)   Wt 182 lb (82.6 kg)   SpO2 100%   BMI 27.67 kg/m   Wt Readings from Last 3 Encounters:  09/30/22 182 lb (82.6 kg)  05/04/22 183 lb (83 kg)  03/27/22 183 lb 2 oz (83.1 kg)      Physical Exam Constitutional:       General: He is not in acute distress.    Appearance: Normal appearance. He is well-developed. He is not ill-appearing or toxic-appearing.  HENT:     Head: Normocephalic and atraumatic.     Right Ear: Hearing, tympanic membrane, ear canal and external ear normal.     Left Ear: Hearing, tympanic membrane, ear canal and external ear normal.     Nose: Nose normal.     Mouth/Throat:     Pharynx: Uvula midline.  Eyes:     General: Lids are normal. Lids are everted, no foreign bodies appreciated.     Conjunctiva/sclera: Conjunctivae normal.     Pupils: Pupils are equal, round, and reactive to light.  Neck:     Thyroid: No thyroid mass or thyromegaly.     Vascular: No carotid bruit.     Trachea: Trachea and phonation normal.  Cardiovascular:     Rate and Rhythm: Normal rate and regular rhythm.     Pulses: Normal pulses.     Heart sounds: S1 normal and S2 normal. No murmur heard.    No gallop.  Pulmonary:     Breath sounds: Normal breath sounds. No wheezing, rhonchi or rales.  Abdominal:     General: Bowel sounds are normal.     Palpations: Abdomen is soft.     Tenderness: There is no abdominal tenderness. There is no guarding or rebound.     Hernia: No hernia is present.  Musculoskeletal:     Cervical back: Normal range of motion and neck supple.  Lymphadenopathy:     Cervical: No cervical adenopathy.  Skin:    General: Skin is warm and dry.     Findings: No rash.  Neurological:     Mental Status: He is alert.     Cranial Nerves: No cranial nerve deficit.     Sensory: No sensory deficit.     Gait: Gait normal.     Deep Tendon Reflexes: Reflexes are normal and symmetric.  Psychiatric:        Speech: Speech normal.        Behavior: Behavior normal.        Judgment: Judgment normal.       Results for orders placed or performed in visit on 09/24/22  Hemoglobin A1c  Result Value Ref Range   Hgb A1c MFr Bld 6.0 4.6 - 6.5 %  Lipid panel  Result Value Ref Range    Cholesterol 162 0 - 200 mg/dL   Triglycerides 91.4 0.0 - 149.0 mg/dL   HDL 78.29 >56.21 mg/dL   VLDL 30.8 0.0 - 65.7 mg/dL   LDL Cholesterol 99 0 - 99 mg/dL   Total CHOL/HDL Ratio 3    NonHDL 115.52   Comprehensive metabolic panel  Result Value Ref Range   Sodium  141 135 - 145 mEq/L   Potassium 4.2 3.5 - 5.1 mEq/L   Chloride 104 96 - 112 mEq/L   CO2 30 19 - 32 mEq/L   Glucose, Bld 90 70 - 99 mg/dL   BUN 20 6 - 23 mg/dL   Creatinine, Ser 1.61 0.40 - 1.50 mg/dL   Total Bilirubin 0.6 0.2 - 1.2 mg/dL   Alkaline Phosphatase 60 39 - 117 U/L   AST 18 0 - 37 U/L   ALT 15 0 - 53 U/L   Total Protein 7.6 6.0 - 8.3 g/dL   Albumin 4.2 3.5 - 5.2 g/dL   GFR 09.60 >45.40 mL/min   Calcium 9.4 8.4 - 10.5 mg/dL    This visit occurred during the SARS-CoV-2 public health emergency.  Safety protocols were in place, including screening questions prior to the visit, additional usage of staff PPE, and extensive cleaning of exam room while observing appropriate contact time as indicated for disinfecting solutions.   COVID 19 screen:  No recent travel or known exposure to COVID19 The patient denies respiratory symptoms of COVID 19 at this time. The importance of social distancing was discussed today.   Assessment and Plan   The patient's preventative maintenance and recommended screening tests for an annual wellness exam were reviewed in full today. Brought up to date unless services declined.  Counselled on the importance of diet, exercise, and its role in overall health and mortality. The patient's FH and SH was reviewed, including their home life, tobacco status, and drug and alcohol status.   Reviewed in detail today 05/14/20 Pt again the same refusals as listed below despite recommendations and risk.  Vaccines: He is not interested in any vaccines  including  prevnar 20 and shingrix, flu.2024  S/P COVID vaccine x 2  Colon: Cologuard  Negative 2017, no further indicated Prostate: per uro [facility],   He refused repeat biopsy.  He has decided (Reviewed uro note) that he does not want any further PSAs. Pt agrees again 09/2022 that he does not want further eval and treatment for possible prostate cancer.  nonsmoker Hep C: check next lab visit.  Problem List Items Addressed This Visit     Elevated PSA, greater than or equal to 20 ng/ml    Pt again refuses further assessment or PSA check.      Hypercholesteremia    Chronic, improvement of LDL below 100. Refused statin in past.. pt now 82, reasonable to hold  off on starting statin.   Encouraged exercise, weight loss, healthy eating habits.       Hypertension    Stable, chronic.  Continue current medication.   lisinoprill HCTZ 20/12.5 mg 1 tablet daily      Prediabetes    Stable control.. low carb diet.      Other Visit Diagnoses     Routine general medical examination at a health care facility    -  Primary        Kerby Nora, MD

## 2022-09-30 NOTE — Assessment & Plan Note (Signed)
Chronic, improvement of LDL below 100. Refused statin in past.. pt now 82, reasonable to hold  off on starting statin.   Encouraged exercise, weight loss, healthy eating habits.

## 2022-09-30 NOTE — Assessment & Plan Note (Signed)
Stable control.. low carb diet. 

## 2022-09-30 NOTE — Assessment & Plan Note (Signed)
Stable, chronic.  Continue current medication. ° ° °lisinoprill HCTZ 20/12.5 mg 1 tablet daily °

## 2022-10-26 DIAGNOSIS — H401131 Primary open-angle glaucoma, bilateral, mild stage: Secondary | ICD-10-CM | POA: Diagnosis not present

## 2023-03-02 ENCOUNTER — Other Ambulatory Visit: Payer: Self-pay | Admitting: Family Medicine

## 2023-04-28 DIAGNOSIS — H401131 Primary open-angle glaucoma, bilateral, mild stage: Secondary | ICD-10-CM | POA: Diagnosis not present

## 2023-05-04 ENCOUNTER — Ambulatory Visit (INDEPENDENT_AMBULATORY_CARE_PROVIDER_SITE_OTHER): Payer: PPO

## 2023-05-04 VITALS — Ht 68.0 in | Wt 182.0 lb

## 2023-05-04 DIAGNOSIS — Z0000 Encounter for general adult medical examination without abnormal findings: Secondary | ICD-10-CM

## 2023-05-04 NOTE — Patient Instructions (Signed)
Mr. Frosch , Thank you for taking time to come for your Medicare Wellness Visit. I appreciate your ongoing commitment to your health goals. Please review the following plan we discussed and let me know if I can assist you in the future.   Referrals/Orders/Follow-Ups/Clinician Recommendations: none  This is a list of the screening recommended for you and due dates:  Health Maintenance  Topic Date Due   Zoster (Shingles) Vaccine (1 of 2) Never done   COVID-19 Vaccine (4 - 2023-24 season) 02/07/2023   Flu Shot  09/06/2023*   Pneumonia Vaccine (1 of 1 - PCV) 09/30/2023*   Medicare Annual Wellness Visit  05/03/2024   HPV Vaccine  Aged Out   DTaP/Tdap/Td vaccine  Discontinued  *Topic was postponed. The date shown is not the original due date.    Advanced directives: (Declined) Advance directive discussed with you today. Even though you declined this today, please call our office should you change your mind, and we can give you the proper paperwork for you to fill out.  Next Medicare Annual Wellness Visit scheduled for next year: Yes 05/05/24 @ 11:30am telephone

## 2023-05-04 NOTE — Progress Notes (Signed)
Subjective:   Bobby Simpson is a 82 y.o. male who presents for Medicare Annual/Subsequent preventive examination.  Visit Complete: Virtual I connected with  [name] on 05/04/23 by a audio enabled telemedicine application and verified that I am speaking with the correct person using two identifiers.  Patient Location: Home  Provider Location: Home Office  I discussed the limitations of evaluation and management by telemedicine. The patient expressed understanding and agreed to proceed.  Vital Signs: Because this visit was a virtual/telehealth visit, some criteria may be missing or patient reported. Any vitals not documented were not able to be obtained and vitals that have been documented are patient reported.  Patient Medicare AWV questionnaire was completed by the patient on (not done); I have confirmed that all information answered by patient is correct and no changes since this date.  Cardiac Risk Factors include: advanced age (>47men, >77 women);hypertension;male gender     Objective:    Today's Vitals   05/04/23 1105  Weight: 182 lb (82.6 kg)  Height: 5\' 8"  (1.727 m)   Body mass index is 27.67 kg/m.     05/04/2023   11:12 AM 05/04/2022    3:17 PM 04/29/2021   10:31 AM 04/24/2020   10:32 AM 04/20/2019   10:42 AM 04/15/2018   10:53 AM 04/12/2017    8:52 AM  Advanced Directives  Does Patient Have a Medical Advance Directive? No No No No No No No  Does patient want to make changes to medical advance directive?    No - Patient declined     Would patient like information on creating a medical advance directive?  No - Patient declined Yes (MAU/Ambulatory/Procedural Areas - Information given)  No - Patient declined No - Patient declined     Current Medications (verified) Outpatient Encounter Medications as of 05/04/2023  Medication Sig   dorzolamide-timolol (COSOPT) 22.3-6.8 MG/ML ophthalmic solution 1 drop 2 (two) times daily.   latanoprost (XALATAN) 0.005 %  ophthalmic solution Place 1 drop into both eyes at bedtime.    lisinopril-hydrochlorothiazide (ZESTORETIC) 20-12.5 MG tablet Take 1 tablet by mouth once daily   Multiple Vitamins-Minerals (ONE-A-DAY 50 PLUS PO) Take 1 tablet by mouth daily.   timolol (TIMOPTIC) 0.5 % ophthalmic solution Place 1 drop into both eyes daily.    diclofenac Sodium (VOLTAREN) 1 % GEL Apply 4 g topically 4 (four) times daily. (Patient not taking: Reported on 05/04/2023)   No facility-administered encounter medications on file as of 05/04/2023.    Allergies (verified) Patient has no known allergies.   History: Past Medical History:  Diagnosis Date   Glaucoma    History of chicken pox    Hypertension    Past Surgical History:  Procedure Laterality Date   CATARACT EXTRACTION, BILATERAL     Family History  Problem Relation Age of Onset   Cancer Father        unsure type   Asthma Brother    Social History   Socioeconomic History   Marital status: Married    Spouse name: Not on file   Number of children: Not on file   Years of education: Not on file   Highest education level: Not on file  Occupational History   Not on file  Tobacco Use   Smoking status: Former    Current packs/day: 0.25    Average packs/day: 0.3 packs/day for 5.0 years (1.3 ttl pk-yrs)    Types: Cigarettes   Smokeless tobacco: Former    Quit date:  06/17/1960  Vaping Use   Vaping status: Never Used  Substance and Sexual Activity   Alcohol use: No    Alcohol/week: 0.0 standard drinks of alcohol    Comment: .25   Drug use: No   Sexual activity: Yes  Other Topics Concern   Not on file  Social History Narrative   Married, 3 grown kids    Born in [place], been here since 1964   Social Determinants of Health   Financial Resource Strain: Low Risk  (05/04/2023)   Overall Financial Resource Strain (CARDIA)    Difficulty of Paying Living Expenses: Not hard at all  Food Insecurity: No Food Insecurity (05/04/2023)   Hunger Vital  Sign    Worried About Running Out of Food in the Last Year: Never true    Ran Out of Food in the Last Year: Never true  Transportation Needs: No Transportation Needs (05/04/2023)   PRAPARE - Administrator, Civil Service (Medical): No    Lack of Transportation (Non-Medical): No  Physical Activity: Insufficiently Active (05/04/2023)   Exercise Vital Sign    Days of Exercise per Week: 7 days    Minutes of Exercise per Session: 10 min  Stress: No Stress Concern Present (05/04/2023)   [employer] of Occupational Health - Occupational Stress Questionnaire    Feeling of Stress : Not at all  Social Connections: Moderately Integrated (05/04/2023)   Social Connection and Isolation Panel [NHANES]    Frequency of Communication with Friends and Family: Twice a week    Frequency of Social Gatherings with Friends and Family: Once a week    Attends Religious Services: More than 4 times per year    Active Member of [employer] or Organizations: No    Attends Engineer, structural: Never    Marital Status: Married    Tobacco Counseling Counseling given: Not Answered   Clinical Intake:  Pre-visit preparation completed: Yes  Pain : No/denies pain     BMI - recorded: 27.67 Nutritional Status: BMI 25 -29 Overweight Nutritional Risks: None Diabetes: No  How often do you need to have someone help you when you read instructions, pamphlets, or other written materials from your doctor or pharmacy?: 1 - Never  Interpreter Needed?: No  Comments: lives with wife Information entered by :: B.Ladonna Vanorder,LPN  Activities of Daily Living    05/04/2023   11:13 AM  In your present state of health, do you have any difficulty performing the following activities:  Hearing? 1  Vision? 1  Difficulty concentrating or making decisions? 0  Walking or climbing stairs? 0  Dressing or bathing? 0  Doing errands, shopping? 0  Preparing Food and eating ? N  Using the Toilet? N  In the past  six months, have you accidently leaked urine? N  Do you have problems with loss of bowel control? N  Managing your Medications? N  Managing your Finances? N  Housekeeping or managing your Housekeeping? N    Patient Care Team: Excell Seltzer, MD as PCP - General (Family Medicine) Sherlyn Lees Lavella Hammock, MD as Referring Physician (Ophthalmology) Dingeldein, Viviann Spare, MD (Ophthalmology)  Indicate any recent Medical Services you may have received from other than [facility] providers in the past year (date may be approximate).     Assessment:   This is a routine wellness examination for Madeline.  Hearing/Vision screen Hearing Screening - Comments:: Pt says no problems with hearing;has hearing aids Vision Screening - Comments:: Pt says his vision is alright w/glasses  Dr Dingeldein   Goals Addressed             This Visit's Progress    COMPLETED: DIET - EAT MORE FRUITS AND VEGETABLES   On track    Increase physical activity   Not on track    When weather permits, I will continue to walk at least 2 miles daily.      COMPLETED: Patient Stated   On track    04/20/2019, I will maintain and continue medications as prescribed.        Depression Screen    05/04/2023   11:10 AM 09/30/2022    9:32 AM 05/04/2022    3:15 PM 04/29/2021   10:33 AM 04/24/2020   10:33 AM 04/20/2019   10:43 AM 04/15/2018   10:52 AM  PHQ 2/9 Scores  PHQ - 2 Score 0 0 0 0 0 0 0  PHQ- 9 Score  0 0  0 0 0    Fall Risk    05/04/2023   11:07 AM 09/30/2022    9:32 AM 05/04/2022    3:18 PM 04/29/2021   10:33 AM 04/24/2020   10:33 AM  Fall Risk   Falls in the past year? 0 0 0 0 0  Number falls in past yr: 0 0 0 0 0  Injury with Fall? 0 0 0 0 0  Risk for fall due to : No Fall Risks No Fall Risks No Fall Risks No Fall Risks Medication side effect  Follow up Education provided;Falls prevention discussed Falls evaluation completed Falls prevention discussed;Falls evaluation completed Falls prevention discussed  Falls evaluation completed;Falls prevention discussed    MEDICARE RISK AT HOME: Medicare Risk at Home Any stairs in or around the home?: Yes If so, are there any without handrails?: Yes Home free of loose throw rugs in walkways, pet beds, electrical cords, etc?: Yes Adequate lighting in your home to reduce risk of falls?: Yes Life alert?: No Use of a cane, walker or w/c?: No Grab bars in the bathroom?: No Shower chair or bench in shower?: No Elevated toilet seat or a handicapped toilet?: No  TIMED UP AND GO:  Was the test performed?  No    Cognitive Function:    04/24/2020   10:35 AM 04/20/2019   10:45 AM 04/15/2018   10:53 AM 04/12/2017    8:51 AM 12/17/2015    9:25 AM  MMSE - Mini Mental State Exam  Orientation to time 5 5 5 5 5   Orientation to Place 5 5 5 5 5   Registration 3 3 3 3 3   Attention/ Calculation 5 5 0 0 0  Recall 3 3 3 3 3   Language- name 2 objects   0 0 0  Language- repeat 1 1 1 1 1   Language- follow 3 step command   3 3 3   Language- read & follow direction   0 0 0  Write a sentence   0 0 0  Copy design   0 0 0  Total score   20 20 20         05/04/2023   11:14 AM 05/04/2022    3:19 PM  6CIT Screen  What Year? 0 points 0 points  What month? 0 points 0 points  What time? 0 points 0 points  Count back from 20 0 points 0 points  Months in reverse 4 points 0 points  Repeat phrase 2 points 0 points  Total Score 6 points 0 points    Immunizations Immunization  History  Administered Date(s) Administered   PFIZER(Purple Top)SARS-COV-2 Vaccination 08/26/2019, 09/19/2019, 05/25/2020    TDAP status: Due, Education has been provided regarding the importance of this vaccine. Advised may receive this vaccine at local pharmacy or Health Dept. Aware to provide a copy of the vaccination record if obtained from local pharmacy or Health Dept. Verbalized acceptance and understanding.  Flu Vaccine status: Declined, Education has been provided regarding the  importance of this vaccine but patient still declined. Advised may receive this vaccine at local pharmacy or Health Dept. Aware to provide a copy of the vaccination record if obtained from local pharmacy or Health Dept. Verbalized acceptance and understanding.  Pneumococcal vaccine status: Declined,  Education has been provided regarding the importance of this vaccine but patient still declined. Advised may receive this vaccine at local pharmacy or Health Dept. Aware to provide a copy of the vaccination record if obtained from local pharmacy or Health Dept. Verbalized acceptance and understanding.   Covid-19 vaccine status: Completed vaccines  Qualifies for Shingles Vaccine? Yes   Zostavax completed No   Shingrix Completed?: No.    Education has been provided regarding the importance of this vaccine. Patient has been advised to call insurance company to determine out of pocket expense if they have not yet received this vaccine. Advised may also receive vaccine at local pharmacy or Health Dept. Verbalized acceptance and understanding.  Screening Tests Health Maintenance  Topic Date Due   COVID-19 Vaccine (4 - 2023-24 season) 05/20/2023 (Originally 02/07/2023)   Zoster Vaccines- Shingrix (1 of 2) 08/04/2023 (Originally 06/26/1992)   INFLUENZA VACCINE  09/06/2023 (Originally 01/07/2023)   Pneumonia Vaccine 62+ Years old (1 of 1 - PCV) 09/30/2023 (Originally 06/27/2007)   Medicare Annual Wellness (AWV)  05/03/2024   HPV VACCINES  Aged Out   DTaP/Tdap/Td  Discontinued    Health Maintenance  There are no preventive care reminders to display for this patient.   Colorectal cancer screening: No longer required.   Lung Cancer Screening: (Low Dose CT Chest recommended if Age 78-80 years, 20 pack-year currently smoking OR have quit w/in 15years.) does not qualify.   Lung Cancer Screening Referral: no  Additional Screening:  Hepatitis C Screening: does not qualify; Completed no  Vision Screening:  Recommended annual ophthalmology exams for early detection of glaucoma and other disorders of the eye. Is the patient up to date with their annual eye exam?  Yes  Who is the provider or what is the name of the office in which the patient attends annual eye exams? Dr Dellie Burns If pt is not established with a provider, would they like to be referred to a provider to establish care? No .   Dental Screening: Recommended annual dental exams for proper oral hygiene  Diabetic Foot Exam: n/a  Community Resource Referral / Chronic Care Management: CRR required this visit?  No   CCM required this visit?  No    Plan:     I have personally reviewed and noted the following in the patient's chart:   Medical and social history Use of alcohol, tobacco or illicit drugs  Current medications and supplements including opioid prescriptions. Patient is not currently taking opioid prescriptions. Functional ability and status Nutritional status Physical activity Advanced directives List of other physicians Hospitalizations, surgeries, and ER visits in previous 12 months Vitals Screenings to include cognitive, depression, and falls Referrals and appointments  In addition, I have reviewed and discussed with patient certain preventive protocols, quality metrics, and best practice recommendations. A  written personalized care plan for preventive services as well as general preventive health recommendations were provided to patient.    Sue Lush, LPN   [id number]   After Visit Summary: (Declined) Due to this being a telephonic visit, with patients personalized plan was offered to patient but patient Declined AVS at this time   Nurse Notes: The patient states he is doing well and has no concerns or questions at this time.

## 2023-05-05 ENCOUNTER — Encounter: Payer: Self-pay | Admitting: Emergency Medicine

## 2023-05-05 ENCOUNTER — Ambulatory Visit
Admission: EM | Admit: 2023-05-05 | Discharge: 2023-05-05 | Disposition: A | Discharge status: Home / Self Care | Payer: PPO | Attending: Emergency Medicine | Admitting: Emergency Medicine

## 2023-05-05 DIAGNOSIS — M79651 Pain in right thigh: Secondary | ICD-10-CM | POA: Diagnosis not present

## 2023-05-05 DIAGNOSIS — H401131 Primary open-angle glaucoma, bilateral, mild stage: Secondary | ICD-10-CM | POA: Diagnosis not present

## 2023-05-05 DIAGNOSIS — Z961 Presence of intraocular lens: Secondary | ICD-10-CM | POA: Diagnosis not present

## 2023-05-05 DIAGNOSIS — Z0100 Encounter for examination of eyes and vision without abnormal findings: Secondary | ICD-10-CM | POA: Diagnosis not present

## 2023-05-05 MED ORDER — DICLOFENAC SODIUM 1 % EX GEL: 2.0000 g | Freq: Four times a day (QID) | CUTANEOUS | 1 refills | Status: AC

## 2023-05-05 NOTE — ED Provider Notes (Signed)
Renaldo Fiddler    CSN: [id number] Arrival date & time: 05/05/23  1434      History   Chief Complaint Chief Complaint  Patient presents with   Leg Pain    HPI Bobby Simpson is a 82 y.o. male.   Presents for evaluation of pain to the posterior right thigh radiating down the leg into the ankle present for 1 to 2 weeks.  Symptoms occurring intermittently, worse at nighttime, interfering with sleep as he is unable to lay onto the right side.  Symptoms improve once the day is started and he begins to move around.  Able to bear weight and has been completing 2 mile walks daily as a part of his normal routine.  Denies numbness or tingling, injury or trauma.  Has not attempted treatment of symptoms.   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 Authorizing Provider  diclofenac Sodium (VOLTAREN) 1 % GEL Apply 2 g topically 4 (four) times daily. 05/05/23  Yes Trachelle Low R, NP  diclofenac Sodium (VOLTAREN) 1 % GEL Apply 4 g topically 4 (four) times daily. Patient not taking: Reported on 05/04/2023 08/09/21   Bing Neighbors, NP  dorzolamide-timolol (COSOPT) 22.3-6.8 MG/ML ophthalmic solution 1 drop 2 (two) times daily. 02/20/22   [provider]  latanoprost (XALATAN) 0.005 % ophthalmic solution Place 1 drop into both eyes at bedtime.  07/19/14    [provider]  lisinopril-hydrochlorothiazide (ZESTORETIC) 20-12.5 MG tablet Take 1 tablet by mouth once daily 03/02/23   Bedsole, Amy E, MD  Multiple Vitamins-Minerals (ONE-A-DAY 50 PLUS PO) Take 1 tablet by mouth daily.    [provider]  timolol (TIMOPTIC) 0.5 % ophthalmic solution Place 1 drop into both eyes daily.  08/07/14   [provider]    Family History Family History  Problem Relation Age of Onset   Cancer Father        unsure type   Asthma Brother     Social History Social History   Tobacco Use   Smoking status: Former    Current packs/day: 0.25    Average packs/day: 0.3 packs/day for 5.0 years (1.3 ttl pk-yrs)    Types: Cigarettes   Smokeless tobacco: Former    Quit date: 06/17/1960  Vaping Use   Vaping status: Never Used  Substance Use Topics   Alcohol use: No    Alcohol/week: 0.0 standard drinks of alcohol    Comment: .25   Drug use: No     Allergies   Patient has no known allergies.   Review of Systems Review of Systems   Physical Exam Triage Vital Signs ED Triage Vitals  Encounter Vitals Group     BP 05/05/23 1449 133/80     Systolic BP Percentile --      Diastolic BP Percentile --  Pulse Rate 05/05/23 1449 (!) 57     Resp 05/05/23 1449 18     Temp 05/05/23 1449 97.9 F (36.6 C)     Temp Source 05/05/23 1449 Oral     SpO2 05/05/23 1449 97 %     Weight --      Height --      Head Circumference --      Peak Flow --      Pain Score 05/05/23 1448 5     Pain Loc --      Pain Education --      Exclude from Growth Chart --    No data found.  Updated Vital Signs BP 133/80 (BP Location: Left Arm)   Pulse (!) 57   Temp 97.9 F (36.6 C) (Oral)   Resp 18   SpO2 97%   Visual Acuity Right Eye Distance:   Left Eye Distance:   Bilateral Distance:    Right Eye Near:   Left Eye Near:    Bilateral Near:     Physical Exam Constitutional:      Appearance: Normal appearance.  Eyes:     Extraocular  Movements: Extraocular movements intact.  Pulmonary:     Effort: Pulmonary effort is normal.  Musculoskeletal:       Legs:     Comments: Point tenderness to left posterior right thigh approximately 1/3-1/2 way up the leg, no ecchymosis swelling or deformity, 2+ popliteal pulse able to bear weight and to complete range of motion to the lower extremity  Neurological:     Mental Status: He is alert and oriented to person, place, and time. Mental status is at baseline.      UC Treatments / Results  Labs (all labs ordered are listed, but only abnormal results are displayed) Labs Reviewed - No data to display  EKG   Radiology No results found.  Procedures Procedures (including critical care time)  Medications Ordered in UC Medications - No data to display  Initial Impression / Assessment and Plan / UC Course  I have reviewed the triage vital signs and the nursing notes.  Pertinent labs & imaging results that were available during my care of the patient were reviewed by me and considered in my medical decision making (see chart for details).  Pain of right thigh  Etiology is most likely muscular, consistent with tendon irritation, discussed with patient, would like to defer use of oral medications therefore prescribed diclofenac gel recommended consistent use for a few days before using as needed, recommended massage, stretching and heat, may continue activity as tolerated recommended follow-up with primary doctor if symptoms continue to persist Final Clinical Impressions(s) / UC Diagnoses   Final diagnoses:  Pain of right thigh     Discharge Instructions      Your evaluated for the pain in your leg which I believe is irritation to the tendon in the thigh that goes into the knee, I believe this will improve with time  You may apply diclofenac gel every 6 hours, use consistently for the next 3 days and you may use as needed  You may stretch out your leg with the can  similarly to how you stretch out your foot  You may use heat over the affected area in 10 to 15-minute intervals  You may continue activity as tolerated  If your pain continues to recur you may follow-up with your primary doctor for reevaluation as needed   ED Prescriptions     Medication  Sig Dispense Auth. Provider   diclofenac Sodium (VOLTAREN) 1 % GEL Apply 2 g topically 4 (four) times daily. 50 g Valinda Hoar, NP      PDMP not reviewed this encounter.   Valinda Hoar, NP 05/05/23 1535

## 2023-05-05 NOTE — Discharge Instructions (Addendum)
Your evaluated for the pain in your leg which I believe is irritation to the tendon in the thigh that goes into the knee, I believe this will improve with time  You may apply diclofenac gel every 6 hours, use consistently for the next 3 days and you may use as needed  You may stretch out your leg with the can similarly to how you stretch out your foot  You may use heat over the affected area in 10 to 15-minute intervals  You may continue activity as tolerated  If your pain continues to recur you may follow-up with your primary doctor for reevaluation as needed

## 2023-05-05 NOTE — ED Triage Notes (Signed)
Pt presents with right leg pain x 1 week. Pt states the pain starts at the back of his thigh and radiates down his right leg. He denies any injury and the pain is off and on.

## 2023-11-08 DIAGNOSIS — H401131 Primary open-angle glaucoma, bilateral, mild stage: Secondary | ICD-10-CM | POA: Diagnosis not present

## 2023-11-08 DIAGNOSIS — Z961 Presence of intraocular lens: Secondary | ICD-10-CM | POA: Diagnosis not present

## 2023-11-22 IMAGING — DX DG KNEE COMPLETE 4+V*L*
4 series · 4 of 4 positions shown · non-contrast
Comparison: None.

CLINICAL DATA: 81-year-old male with 5 days of left knee pain.
Limited range of motion. No known injury.

EXAM:
LEFT KNEE - COMPLETE 4+ VIEW

[knee ap]
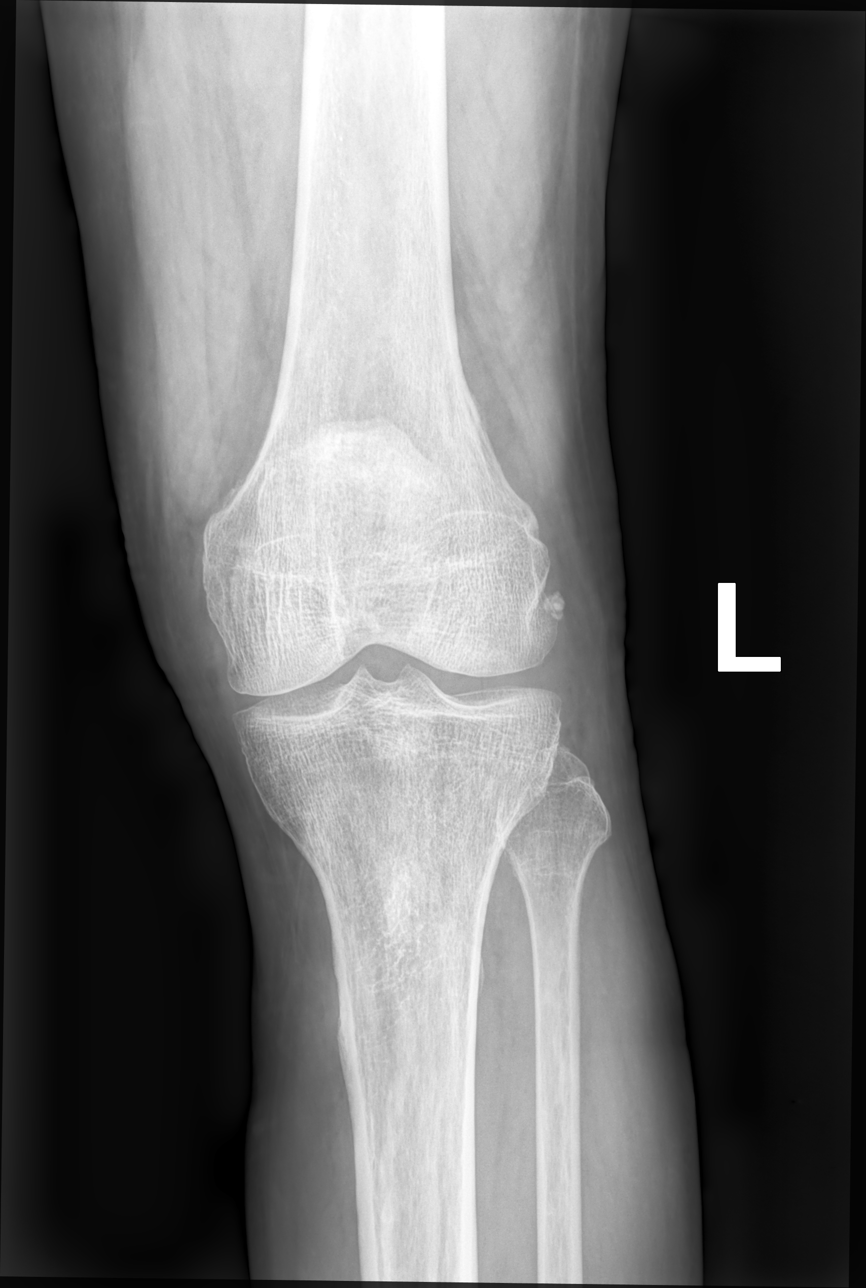

[knee mlo]
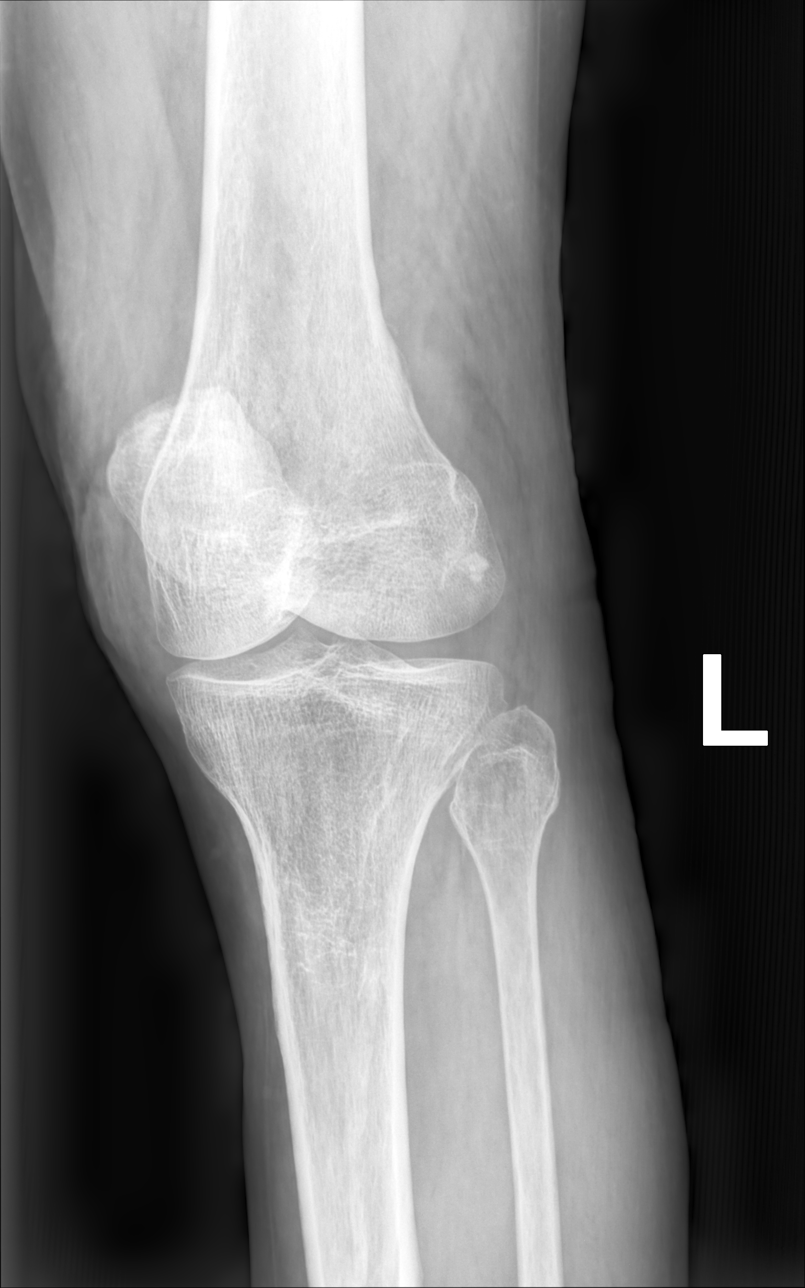

[knee lmo]
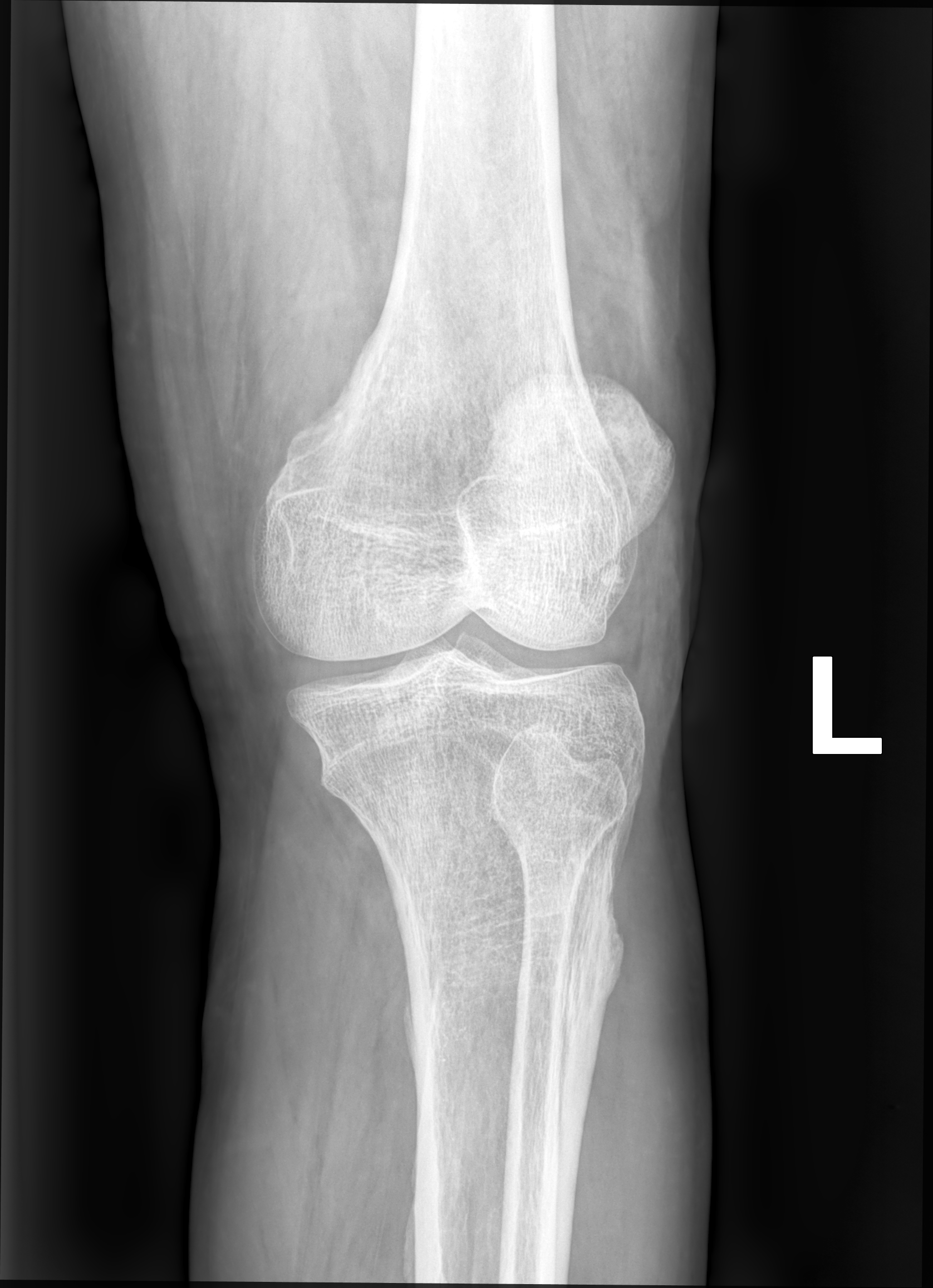

[knee lat]
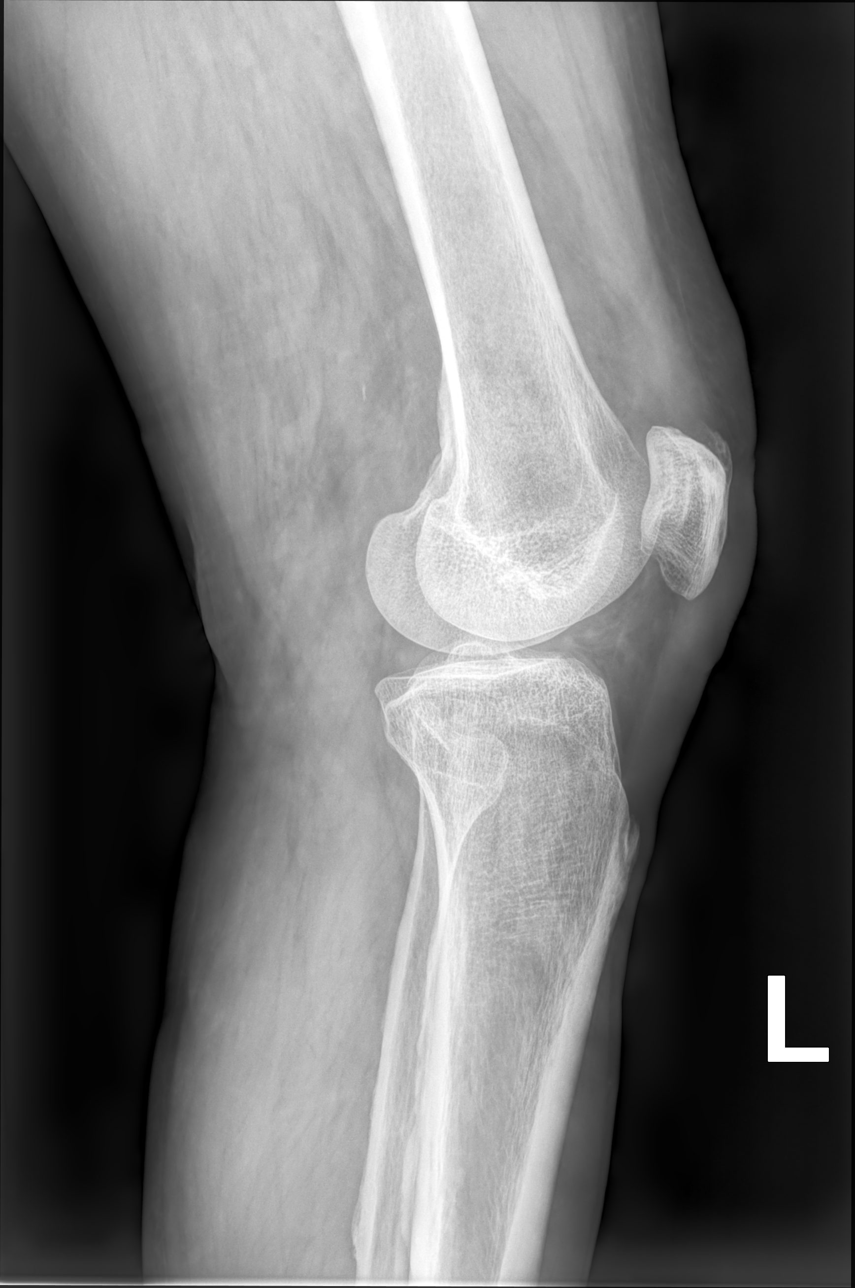

[4 of 4 positions shown; findings below may reference images not displayed]

FINDINGS: Possible small suprapatellar joint effusion. Normal joint spaces and
alignment for age. Bone mineralization is within normal limits.
Patella intact. No acute osseous abnormality identified.
IMPRESSION: Possible small joint effusion but otherwise negative for age
radiographic appearance of the left knee.

## 2024-01-17 ENCOUNTER — Encounter: Payer: Self-pay | Admitting: Pharmacist

## 2024-01-17 NOTE — Progress Notes (Signed)
 Pharmacy Quality Measure Review  This patient is appearing on a report for being at risk of failing the adherence measure for hypertension (ACEi/ARB) medications this calendar year.   Medication: lisinopril /hydrochlorothiazide  20-12.5 mg Last fill date: 08/31/23 for 90 day supply  Patient has only filled this Rx twice in the last 12 months.  Last refill filled March of this year x90 days.   Patient not seen in the past 12 months, no record of blood pressures since Nov of last year.  PCP messaged regarding no refills remaining/no PCP follow up scheduled.   Future Appointments  Date Time Provider Department Center  05/02/2024 11:30 AM [facility] ANNUAL WELLNESS VISIT 2 [facility]

## 2024-01-18 NOTE — Progress Notes (Signed)
 BP Readings from Last 3 Encounters:  05/05/23 133/80  09/30/22 110/62  03/27/22 118/70   Call patient Pharmacy has let us  know that he has not been filling the blood pressure medication.  Is he taking this?  Where he is his blood pressure running?  He is overdue for a complete physical exam with labs prior.  Please have him schedule.

## 2024-01-19 NOTE — Progress Notes (Signed)
 Called pt, sch cpe on 02/10/24. Pt requested labs to be done same day.

## 2024-01-19 NOTE — Progress Notes (Signed)
 Spoke with Mr. Strawderman.  He states he takes his BP medication every other day.  He does not check it at home.  He has been scheduled for his CPE on 02/10/24.

## 2024-01-19 NOTE — Progress Notes (Signed)
 Please call and schedule CPE with fasting labs prior with Dr. Ermalene Searing.

## 2024-02-10 ENCOUNTER — Encounter: Payer: Self-pay | Admitting: Family Medicine

## 2024-02-10 ENCOUNTER — Ambulatory Visit: Payer: Self-pay | Admitting: Family Medicine

## 2024-02-10 ENCOUNTER — Ambulatory Visit (INDEPENDENT_AMBULATORY_CARE_PROVIDER_SITE_OTHER): Admitting: Family Medicine

## 2024-02-10 VITALS — BP 122/80 | HR 51 | Temp 98.3°F | Ht 68.0 in | Wt 180.2 lb

## 2024-02-10 DIAGNOSIS — E7800 Pure hypercholesterolemia, unspecified: Secondary | ICD-10-CM

## 2024-02-10 DIAGNOSIS — D400 Neoplasm of uncertain behavior of prostate: Secondary | ICD-10-CM

## 2024-02-10 DIAGNOSIS — Z0000 Encounter for general adult medical examination without abnormal findings: Secondary | ICD-10-CM

## 2024-02-10 DIAGNOSIS — R972 Elevated prostate specific antigen [PSA]: Secondary | ICD-10-CM

## 2024-02-10 DIAGNOSIS — R7303 Prediabetes: Secondary | ICD-10-CM | POA: Diagnosis not present

## 2024-02-10 DIAGNOSIS — I10 Essential (primary) hypertension: Secondary | ICD-10-CM | POA: Diagnosis not present

## 2024-02-10 LAB — COMPREHENSIVE METABOLIC PANEL WITH GFR
ALT: 17 U/L (ref 0–53)
AST: 16 U/L (ref 0–37)
Albumin: 4 g/dL (ref 3.5–5.2)
Alkaline Phosphatase: 50 U/L (ref 39–117)
BUN: 24 mg/dL — ABNORMAL HIGH (ref 6–23)
CO2: 30 meq/L (ref 19–32)
Calcium: 8.7 mg/dL (ref 8.4–10.5)
Chloride: 106 meq/L (ref 96–112)
Creatinine, Ser: 1.08 mg/dL (ref 0.40–1.50)
GFR: 64.31 mL/min (ref >60.00)
Glucose, Bld: 92 mg/dL (ref 70–99)
Potassium: 4.1 meq/L (ref 3.5–5.1)
Sodium: 142 meq/L (ref 135–145)
Total Bilirubin: 0.7 mg/dL (ref 0.2–1.2)
Total Protein: 7 g/dL (ref 6.0–8.3)

## 2024-02-10 LAB — LIPID PANEL
Cholesterol: 151 mg/dL (ref 0–200)
HDL: 39.2 mg/dL (ref >39.00)
LDL Cholesterol: 97 mg/dL (ref 0–99)
NonHDL: 111.5
Total CHOL/HDL Ratio: 4
Triglycerides: 71 mg/dL (ref 0.0–149.0)
VLDL: 14.2 mg/dL (ref 0.0–40.0)

## 2024-02-10 LAB — HEMOGLOBIN A1C: Hgb A1c MFr Bld: 6.2 % (ref 4.6–6.5)

## 2024-02-10 MED ORDER — LISINOPRIL-HYDROCHLOROTHIAZIDE 20-12.5 MG PO TABS: 1.0000 | ORAL_TABLET | ORAL | 3 refills | Status: AC

## 2024-02-10 NOTE — Assessment & Plan Note (Signed)
 Due for re-eval.

## 2024-02-10 NOTE — Assessment & Plan Note (Signed)
Not interested in further evaluation or treatment.

## 2024-02-10 NOTE — Assessment & Plan Note (Signed)
 per arlin Hails,  He refused repeat biopsy.  He has decided (Reviewed uro note) that he does not want any further PSAs. Pt agrees again 02/2024 that he does not want further eval and treatment for possible prostate cancer.

## 2024-02-10 NOTE — Assessment & Plan Note (Addendum)
 Stable, chronic.  Continue current medication.   lisinoprill HCTZ 20/12.5 mg 1 tablet  every other day.. pt does not want to take daily.. refuses.

## 2024-02-10 NOTE — Progress Notes (Signed)
 Patient ID: Bobby Simpson, male    DOB: 08-10-1940, 83 y.o.   MRN: [id number]  This visit was conducted in person.  BP 122/80   Pulse (!) 51   Temp 98.3 F (36.8 C) (Oral)   Ht 5' 8 (1.727 m)   Wt 180 lb 4 oz (81.8 kg)   SpO2 98%   BMI 27.41 kg/m    CC: Chief Complaint  Patient presents with   Annual Exam    Pt here for CPE and is accompanied by wife Bobby Simpson    Subjective:   HPI: Bobby Simpson is a 83 y.o. male presenting on 02/10/2024 for Annual Exam (Pt here for CPE and is accompanied by wife Bobby Simpson)   The patient presents for complete physical and review of chronic health problems. He/She also has the following acute concerns today: none  The patient saw a LPN or RN for medicare wellness visit.  04/2023  Prevention and wellness was reviewed in detail. Note reviewed and important notes copied below if needed.  Hypertension:   Good control on lisinopril  hydrochlorothiazide  every other day. BP Readings from Last 3 Encounters:  02/10/24 122/80  05/05/23 133/80  09/30/22 110/62  Using medication without problems or lightheadedness:  none Chest pain with exertion:none Edema:none Short of breath:none Average home BPs: not chking. Other issues:   Prediabetes  Lab Results  Component Value Date   HGBA1C 6.0 09/24/2022   Wt Readings from Last 3 Encounters:  02/10/24 180 lb 4 oz (81.8 kg)  05/04/23 182 lb (82.6 kg)  09/30/22 182 lb (82.6 kg)    Elevated Cholesterol:  Improved LDL<100, HDl above 40. Lab Results  Component Value Date   CHOL 162 09/24/2022   HDL 46.80 09/24/2022   LDLCALC 99 09/24/2022   TRIG 83.0 09/24/2022   CHOLHDL 3 09/24/2022   The ASCVD Risk score (Arnett DK, et al., 2019) failed to calculate for the following reasons:   The 2019 ASCVD risk score is only valid for ages 69 to 40 Using medications without problems: Muscle aches:  Diet compliance: moderate Exercise: walking daily  2 miles daily, garden, work in yard. Other  complaints:  Flowsheet Row Office Visit from 02/10/2024 in [facility] Total Score 0       Relevant past medical, surgical, family and social history reviewed and updated as indicated. Interim medical history since our last visit reviewed. Allergies and medications reviewed and updated. Outpatient Medications Prior to Visit  Medication Sig Dispense Refill   diclofenac  Sodium (VOLTAREN ) 1 % GEL Apply 4 g topically 4 (four) times daily. 100 g 0   diclofenac  Sodium (VOLTAREN ) 1 % GEL Apply 2 g topically 4 (four) times daily. 50 g 1   dorzolamide-timolol (COSOPT) 22.3-6.8 MG/ML ophthalmic solution 1 drop 2 (two) times daily.     latanoprost (XALATAN) 0.005 % ophthalmic solution Place 1 drop into both eyes at bedtime.   2   Multiple Vitamins-Minerals (ONE-A-DAY 50 PLUS PO) Take 1 tablet by mouth daily.     timolol (TIMOPTIC) 0.5 % ophthalmic solution Place 1 drop into both eyes daily.   0   lisinopril -hydrochlorothiazide  (ZESTORETIC ) 20-12.5 MG tablet Take 1 tablet by mouth once daily 90 tablet 1   No facility-administered medications prior to visit.     Per HPI unless specifically indicated in ROS section below Review of Systems  Constitutional:  Negative for fatigue and fever.  HENT:  Negative for ear pain.  Eyes:  Negative for pain.  Respiratory:  Negative for cough and shortness of breath.   Cardiovascular:  Negative for chest pain, palpitations and leg swelling.  Gastrointestinal:  Negative for abdominal pain.  Genitourinary:  Negative for dysuria.  Musculoskeletal:  Negative for arthralgias.  Neurological:  Negative for syncope, light-headedness and headaches.  Psychiatric/Behavioral:  Negative for dysphoric mood.    Objective:  BP 122/80   Pulse (!) 51   Temp 98.3 F (36.8 C) (Oral)   Ht 5' 8 (1.727 m)   Wt 180 lb 4 oz (81.8 kg)   SpO2 98%   BMI 27.41 kg/m   Wt Readings from Last 3 Encounters:  02/10/24 180 lb 4 oz (81.8 kg)   05/04/23 182 lb (82.6 kg)  09/30/22 182 lb (82.6 kg)      Physical Exam Constitutional:      General: He is not in acute distress.    Appearance: Normal appearance. He is well-developed. He is not ill-appearing or toxic-appearing.  HENT:     Head: Normocephalic and atraumatic.     Right Ear: Hearing, tympanic membrane, ear canal and external ear normal.     Left Ear: Hearing, tympanic membrane, ear canal and external ear normal.     Nose: Nose normal.     Mouth/Throat:     Pharynx: Uvula midline.  Eyes:     General: Lids are normal. Lids are everted, no foreign bodies appreciated.     Conjunctiva/sclera: Conjunctivae normal.     Pupils: Pupils are equal, round, and reactive to light.  Neck:     Thyroid: No thyroid mass or thyromegaly.     Vascular: No carotid bruit.     Trachea: Trachea and phonation normal.  Cardiovascular:     Rate and Rhythm: Normal rate and regular rhythm.     Pulses: Normal pulses.     Heart sounds: S1 normal and S2 normal. No murmur heard.    No gallop.  Pulmonary:     Breath sounds: Normal breath sounds. No wheezing, rhonchi or rales.  Abdominal:     General: Bowel sounds are normal.     Palpations: Abdomen is soft.     Tenderness: There is no abdominal tenderness. There is no guarding or rebound.     Hernia: No hernia is present.  Musculoskeletal:     Cervical back: Normal range of motion and neck supple.  Lymphadenopathy:     Cervical: No cervical adenopathy.  Skin:    General: Skin is warm and dry.     Findings: No rash.  Neurological:     Mental Status: He is alert.     Cranial Nerves: No cranial nerve deficit.     Sensory: No sensory deficit.     Gait: Gait normal.     Deep Tendon Reflexes: Reflexes are normal and symmetric.  Psychiatric:        Speech: Speech normal.        Behavior: Behavior normal.        Judgment: Judgment normal.       Results for orders placed or performed in visit on 09/24/22  Hemoglobin A1c   Collection  Time: 09/24/22  7:56 AM  Result Value Ref Range   Hgb A1c MFr Bld 6.0 4.6 - 6.5 %  Lipid panel   Collection Time: 09/24/22  7:56 AM  Result Value Ref Range   Cholesterol 162 0 - 200 mg/dL   Triglycerides 16.9 0.0 - 149.0 mg/dL   HDL 53.19 >60.99  mg/dL   VLDL 83.3 0.0 - 59.9 mg/dL   LDL Cholesterol 99 0 - 99 mg/dL   Total CHOL/HDL Ratio 3    NonHDL 115.52   Comprehensive metabolic panel   Collection Time: 09/24/22  7:56 AM  Result Value Ref Range   Sodium 141 135 - 145 mEq/L   Potassium 4.2 3.5 - 5.1 mEq/L   Chloride 104 96 - 112 mEq/L   CO2 30 19 - 32 mEq/L   Glucose, Bld 90 70 - 99 mg/dL   BUN 20 6 - 23 mg/dL   Creatinine, Ser 8.93 0.40 - 1.50 mg/dL   Total Bilirubin 0.6 0.2 - 1.2 mg/dL   Alkaline Phosphatase 60 39 - 117 U/L   AST 18 0 - 37 U/L   ALT 15 0 - 53 U/L   Total Protein 7.6 6.0 - 8.3 g/dL   Albumin 4.2 3.5 - 5.2 g/dL   GFR 33.58 >39.99 mL/min   Calcium 9.4 8.4 - 10.5 mg/dL    This visit occurred during the SARS-CoV-2 public health emergency.  Safety protocols were in place, including screening questions prior to the visit, additional usage of staff PPE, and extensive cleaning of exam room while observing appropriate contact time as indicated for disinfecting solutions.   COVID 19 screen:  No recent travel or known exposure to COVID19 The patient denies respiratory symptoms of COVID 19 at this time. The importance of social distancing was discussed today.   Assessment and Plan   The patient's preventative maintenance and recommended screening tests for an annual wellness exam were reviewed in full today. Brought up to date unless services declined.  Counselled on the importance of diet, exercise, and its role in overall health and mortality. The patient's FH and SH was reviewed, including their home life, tobacco status, and drug and alcohol status.   Reviewed in detail today 05/14/20 Pt again the same refusals as listed below despite recommendations and  risk.  Vaccines: He is not interested in any vaccines  including  prevnar 20 and shingrix, flu.2024  S/P COVID vaccine x 2  Colon: Cologuard  Negative 2017, no further indicated Prostate: per uro [facility],  He refused repeat biopsy.  He has decided (Reviewed uro note) that he does not want any further PSAs or biopsy or MRI. Pt agrees again 02/2024 that he does not want further eval and treatment for possible prostate cancer.  nonsmoker Hep C:  done  Former remote smoker  No ETOH, no drugs.  Problem List Items Addressed This Visit     Elevated PSA, greater than or equal to 20 ng/ml   per arlin Hails,  He refused repeat biopsy.  He has decided (Reviewed uro note) that he does not want any further PSAs. Pt agrees again 02/2024 that he does not want further eval and treatment for possible prostate cancer.      Hypercholesteremia    Due for re-eval.      Relevant Medications   lisinopril -hydrochlorothiazide  (ZESTORETIC ) 20-12.5 MG tablet   Other Relevant Orders   Lipid panel   Comprehensive metabolic panel with GFR   Hypertension   Stable, chronic.  Continue current medication.   lisinoprill HCTZ 20/12.5 mg 1 tablet daily      Relevant Medications   lisinopril -hydrochlorothiazide  (ZESTORETIC ) 20-12.5 MG tablet   Neoplasm of uncertain behavior of prostate   Not interested in further evaluation or treatment.      Prediabetes    Due for re-eval.      Relevant  Orders   Hemoglobin A1c   Other Visit Diagnoses       Routine general medical examination at a health care facility    -  Primary         Greig Ring, MD

## 2024-05-02 ENCOUNTER — Ambulatory Visit

## 2024-05-02 ENCOUNTER — Encounter

## 2024-05-02 VITALS — Ht 66.0 in | Wt 180.0 lb

## 2024-05-02 DIAGNOSIS — Z0000 Encounter for general adult medical examination without abnormal findings: Secondary | ICD-10-CM

## 2024-05-02 NOTE — Progress Notes (Signed)
 Chief Complaint  Patient presents with   Medicare Wellness     Subjective:  Please attest and cosign this visit due to patients primary care provider not being in the office at the time the visit was completed.  (Pt of Dr Greig Ring)   Bobby Simpson is a 83 y.o. male who presents for a Medicare Annual Wellness Visit.  I connected with  Bobby Simpson on 05/02/24 by a audio enabled telemedicine application and verified that I am speaking with the correct person using two identifiers.  Patient Location: Home  Provider Location: Office/Clinic  Persons Participating in Visit: Patient.  I discussed the limitations of evaluation and management by telemedicine. The patient expressed understanding and agreed to proceed.  Vital Signs: Because this visit was a virtual/telehealth visit, some criteria may be missing or patient reported. Any vitals not documented were not able to be obtained and vitals that have been documented are patient reported.   Allergies (verified) Patient has no known allergies.   History: Past Medical History:  Diagnosis Date   Glaucoma    History of chicken pox    Hypertension    Past Surgical History:  Procedure Laterality Date   CATARACT EXTRACTION, BILATERAL     Family History  Problem Relation Age of Onset   Cancer Father        unsure type   Asthma Brother    Social History   Occupational History   Not on file  Tobacco Use   Smoking status: Former    Current packs/day: 0.25    Average packs/day: 0.3 packs/day for 5.0 years (1.3 ttl pk-yrs)    Types: Cigarettes   Smokeless tobacco: Former    Quit date: 06/17/1960  Vaping Use   Vaping status: Never Used  Substance and Sexual Activity   Alcohol use: No    Alcohol/week: 0.0 standard drinks of alcohol    Comment: .25   Drug use: No   Sexual activity: Yes   Tobacco Counseling Counseling given: Not Answered  SDOH Screenings   Food Insecurity: No Food Insecurity (05/02/2024)   Housing: Unknown (05/02/2024)  Transportation Needs: No Transportation Needs (05/02/2024)  Utilities: Not At Risk (05/02/2024)  Alcohol Screen: Low Risk  (05/04/2023)  Depression (PHQ2-9): Low Risk  (05/02/2024)  Financial Resource Strain: Low Risk  (05/04/2023)  Physical Activity: Insufficiently Active (05/02/2024)  Social Connections: Moderately Integrated (05/02/2024)  Stress: No Stress Concern Present (05/02/2024)  Tobacco Use: Medium Risk (05/02/2024)  Health Literacy: Adequate Health Literacy (05/02/2024)   See flowsheets for full screening details  Depression Screen PHQ 2 & 9 Depression Scale- Over the past 2 weeks, how often have you been bothered by any of the following problems? Little interest or pleasure in doing things: 0 Feeling down, depressed, or hopeless (PHQ Adolescent also includes...irritable): 0 PHQ-2 Total Score: 0 Trouble falling or staying asleep, or sleeping too much: 0 Feeling tired or having little energy: 0 Poor appetite or overeating (PHQ Adolescent also includes...weight loss): 0 Feeling bad about yourself - or that you are a failure or have let yourself or your family down: 0 Trouble concentrating on things, such as reading the newspaper or watching television (PHQ Adolescent also includes...like school work): 0 Moving or speaking so slowly that other people could have noticed. Or the opposite - being so fidgety or restless that you have been moving around a lot more than usual: 0 Thoughts that you would be better off dead, or of hurting yourself in some way:  0 PHQ-9 Total Score: 0 If you checked off any problems, how difficult have these problems made it for you to do your work, take care of things at home, or get along with other people?: Not difficult at all  Depression Treatment Depression Interventions/Treatment : EYV7-0 Score <4 Follow-up Not Indicated     Goals Addressed               This Visit's Progress     Patient Stated (pt-stated)         Patient stated he plans to continue walking       Visit info / Clinical Intake: Medicare Wellness Visit Type:: Subsequent Annual Wellness Visit Persons participating in visit:: patient Medicare Wellness Visit Mode:: Telephone If telephone:: video declined Because this visit was a virtual/telehealth visit:: pt reported vitals If Telephone or Video please confirm:: I connected with the patient using audio enabled telemedicine application and verified that I am speaking with the correct person using two identifiers; I discussed the limitations of evaluation and management by telemedicine; The patient expressed understanding and agreed to proceed Patient Location:: Home Provider Location:: Office Information given by:: patient Interpreter Needed?: No Pre-visit prep was completed: yes AWV questionnaire completed by patient prior to visit?: no Living arrangements:: lives with spouse/significant other Patient's Overall Health Status Rating: good Typical amount of pain: none Does pain affect daily life?: no Are you currently prescribed opioids?: no  Dietary Habits and Nutritional Risks How many meals a day?: 2 Eats fruit and vegetables daily?: yes Most meals are obtained by: preparing own meals; eating out; having others provide food In the last 2 weeks, have you had any of the following?: none Diabetic:: no  Functional Status Activities of Daily Living (to include ambulation/medication): Independent Ambulation: Independent with device- listed below Home Assistive Devices/Equipment: Eyeglasses; Dentures (specify type) (wears hearing aids) Medication Administration: Independent Home Management: Independent Manage your own finances?: yes Primary transportation is: driving Concerns about vision?: no *vision screening is required for WTM* Concerns about hearing?: (!) yes Uses hearing aids?: (!) yes Hear whispered voice?: yes  Fall Screening Falls in the past year?: 0 Number  of falls in past year: 0 Was there an injury with Fall?: 0 Fall Risk Category Calculator: 0 Patient Fall Risk Level: Low Fall Risk  Fall Risk Patient at Risk for Falls Due to: No Fall Risks Fall risk Follow up: Falls evaluation completed; Falls prevention discussed  Home and Transportation Safety: All rugs have non-skid backing?: N/A, no rugs All stairs or steps have railings?: yes Grab bars in the bathtub or shower?: (!) no Have non-skid surface in bathtub or shower?: yes Good home lighting?: yes Regular seat belt use?: yes Hospital stays in the last year:: no  Cognitive Assessment Difficulty concentrating, remembering, or making decisions? : no Will 6CIT or Mini Cog be Completed: yes What year is it?: 0 points What month is it?: 0 points Give patient an address phrase to remember (5 components): [address] [place], [place] About what time is it?: 0 points Count backwards from 20 to 1: 0 points Say the months of the year in reverse: 0 points Repeat the address phrase from earlier: 4 points ([place]; [place]) 6 CIT Score: 4 points  Advance Directives (For Healthcare) Does Patient Have a Medical Advance Directive?: No Would patient like information on creating a medical advance directive?: No - Patient declined  Reviewed/Updated  Reviewed/Updated: Reviewed All (Medical, Surgical, Family, Medications, Allergies, Care Teams, Patient Goals)  Objective:    Today's Vitals   05/02/24 1206  Weight: 180 lb (81.6 kg)  Height: 5' 6 (1.676 m)   Body mass index is 29.05 kg/m.  Current Medications (verified) Outpatient Encounter Medications as of 05/02/2024  Medication Sig   diclofenac  Sodium (VOLTAREN ) 1 % GEL Apply 4 g topically 4 (four) times daily.   diclofenac  Sodium (VOLTAREN ) 1 % GEL Apply 2 g topically 4 (four) times daily.   dorzolamide-timolol (COSOPT) 22.3-6.8 MG/ML ophthalmic solution 1 drop 2 (two) times daily.   latanoprost (XALATAN) 0.005 % ophthalmic  solution Place 1 drop into both eyes at bedtime.    lisinopril -hydrochlorothiazide  (ZESTORETIC ) 20-12.5 MG tablet Take 1 tablet by mouth every other day.   Multiple Vitamins-Minerals (ONE-A-DAY 50 PLUS PO) Take 1 tablet by mouth daily.   timolol (TIMOPTIC) 0.5 % ophthalmic solution Place 1 drop into both eyes daily.    No facility-administered encounter medications on file as of 05/02/2024.   Hearing/Vision screen Hearing Screening - Comments:: Denies hearing difficulties   Vision Screening - Comments:: Wears rx glasses - up to date with routine eye exams with [facility] Immunizations and Health Maintenance Health Maintenance  Topic Date Due   Pneumococcal Vaccine: 50+ Years (1 of 1 - PCV) Never done   Zoster Vaccines- Shingrix (1 of 2) Never done   COVID-19 Vaccine (4 - 2025-26 season) 02/07/2024   Influenza Vaccine  09/05/2024 (Originally 01/07/2024)   Medicare Annual Wellness (AWV)  05/02/2025   Meningococcal B Vaccine  Aged Out   DTaP/Tdap/Td  Discontinued        Assessment/Plan:  This is a routine wellness examination for Bobby Simpson.  I have recommended that this patient have a immunization for Influenza and Pneumonia but he declines at this time. I have discussed the risks and benefits of this procedure with him. The patient verbalizes understanding.   Patient Care Team: Avelina Greig BRAVO, MD as PCP - General (Family Medicine) Burnie Donzell Hollow, MD as Referring Physician (Ophthalmology) Dingeldein, Elspeth, MD (Ophthalmology)  I have personally reviewed and noted the following in the patient's chart:   Medical and social history Use of alcohol, tobacco or illicit drugs  Current medications and supplements including opioid prescriptions. Functional ability and status Nutritional status Physical activity Advanced directives List of other physicians Hospitalizations, surgeries, and ER visits in previous 12 months Vitals Screenings to include cognitive, depression, and  falls Referrals and appointments  No orders of the defined types were placed in this encounter.  In addition, I have reviewed and discussed with patient certain preventive protocols, quality metrics, and best practice recommendations. A written personalized care plan for preventive services as well as general preventive health recommendations were provided to patient.   Bobby Simpson, CMA   05/02/2024   Return in 1 year (on 05/02/2025).  After Visit Summary: (Declined) Due to this being a telephonic visit, with patients personalized plan was offered to patient but patient Declined AVS at this time   Nurse Notes: Scheduled 2026 AWV/CPE appts

## 2024-05-02 NOTE — Patient Instructions (Addendum)
 Bobby Simpson,  Thank you for taking the time for your Medicare Wellness Visit. I appreciate your continued commitment to your health goals. Please review the care plan we discussed, and feel free to reach out if I can assist you further.  Please note that Annual Wellness Visits do not include a physical exam. Some assessments may be limited, especially if the visit was conducted virtually. If needed, we may recommend an in-person follow-up with your provider.  Ongoing Care Seeing your primary care provider every 3 to 6 months helps us  monitor your health and provide consistent, personalized care.   Referrals If a referral was made during today's visit and you haven't received any updates within two weeks, please contact the referred provider directly to check on the status.  Recommended Screenings:  Health Maintenance  Topic Date Due   Pneumococcal Vaccine for age over 35 (1 of 1 - PCV) Never done   Zoster (Shingles) Vaccine (1 of 2) Never done   COVID-19 Vaccine (4 - 2025-26 season) 02/07/2024   Flu Shot  09/05/2024*   Medicare Annual Wellness Visit  05/02/2025   Meningitis B Vaccine  Aged Out   DTaP/Tdap/Td vaccine  Discontinued  *Topic was postponed. The date shown is not the original due date.       05/02/2024   12:08 PM  Advanced Directives  Does Patient Have a Medical Advance Directive? No  Would patient like information on creating a medical advance directive? No - Patient declined    Vision: Annual vision screenings are recommended for early detection of glaucoma, cataracts, and diabetic retinopathy. These exams can also reveal signs of chronic conditions such as diabetes and high blood pressure.  Dental: Annual dental screenings help detect early signs of oral cancer, gum disease, and other conditions linked to overall health, including heart disease and diabetes.

## 2024-05-15 DIAGNOSIS — H401131 Primary open-angle glaucoma, bilateral, mild stage: Secondary | ICD-10-CM | POA: Diagnosis not present

## 2024-07-12 ENCOUNTER — Telehealth: Payer: Self-pay | Admitting: Family Medicine

## 2024-07-12 NOTE — Telephone Encounter (Signed)
 Error

## 2025-05-08 ENCOUNTER — Encounter: Admitting: Family Medicine

## 2025-05-08 ENCOUNTER — Ambulatory Visit
